# Patient Record
Sex: Female | Born: 1985 | Hispanic: Yes | Marital: Married | Smoking: Never smoker
Health system: Southern US, Community
[De-identification: ages and names within clinical notes are randomized; demographics above are authoritative.]

## PROBLEM LIST (undated history)

## (undated) DIAGNOSIS — Z789 Other specified health status: Secondary | ICD-10-CM

---

## 2009-07-15 ENCOUNTER — Ambulatory Visit (HOSPITAL_COMMUNITY): Admission: RE | Admit: 2009-07-15 | Discharge: 2009-07-15 | Payer: Self-pay | Admitting: Obstetrics

## 2009-12-10 ENCOUNTER — Ambulatory Visit (HOSPITAL_COMMUNITY): Admission: RE | Admit: 2009-12-10 | Discharge: 2009-12-10 | Payer: Self-pay | Admitting: Obstetrics

## 2010-02-19 ENCOUNTER — Inpatient Hospital Stay (HOSPITAL_COMMUNITY): Admission: AD | Admit: 2010-02-19 | Discharge: 2010-02-21 | Payer: Self-pay | Admitting: Obstetrics

## 2010-02-19 ENCOUNTER — Inpatient Hospital Stay (HOSPITAL_COMMUNITY): Admission: AD | Admit: 2010-02-19 | Discharge: 2010-02-19 | Payer: Self-pay | Admitting: Obstetrics

## 2010-02-24 ENCOUNTER — Inpatient Hospital Stay (HOSPITAL_COMMUNITY): Admission: AD | Admit: 2010-02-24 | Discharge: 2010-02-24 | Payer: Self-pay | Admitting: Obstetrics and Gynecology

## 2010-12-20 LAB — URINE CULTURE: Culture: NO GROWTH

## 2010-12-20 LAB — CBC
Hemoglobin: 13.2 g/dL (ref 12.0–15.0)
RBC: 3.28 MIL/uL — ABNORMAL LOW (ref 3.87–5.11)
RBC: 3.99 MIL/uL (ref 3.87–5.11)
RDW: 13.1 % (ref 11.5–15.5)
RDW: 13.4 % (ref 11.5–15.5)

## 2010-12-20 LAB — URINALYSIS, ROUTINE W REFLEX MICROSCOPIC
Glucose, UA: NEGATIVE mg/dL
Nitrite: NEGATIVE
Specific Gravity, Urine: 1.015 (ref 1.005–1.030)
Urobilinogen, UA: 1 mg/dL (ref 0.0–1.0)
pH: 7 (ref 5.0–8.0)

## 2010-12-20 LAB — URINE MICROSCOPIC-ADD ON

## 2010-12-20 LAB — RH IMMUNE GLOB WKUP(>/=20WKS)(NOT WOMEN'S HOSP)

## 2010-12-27 LAB — RH IMMUNE GLOBULIN WORKUP (NOT WOMEN'S HOSP): ABO/RH(D): O NEG

## 2012-02-18 ENCOUNTER — Ambulatory Visit: Payer: Self-pay | Admitting: Family Medicine

## 2012-02-18 VITALS — BP 103/69 | HR 76 | Temp 98.0°F | Resp 16 | Ht 64.5 in | Wt 142.4 lb

## 2012-02-18 DIAGNOSIS — M542 Cervicalgia: Secondary | ICD-10-CM

## 2012-02-18 DIAGNOSIS — Z789 Other specified health status: Secondary | ICD-10-CM

## 2012-02-18 DIAGNOSIS — N926 Irregular menstruation, unspecified: Secondary | ICD-10-CM

## 2012-02-18 DIAGNOSIS — N949 Unspecified condition associated with female genital organs and menstrual cycle: Secondary | ICD-10-CM

## 2012-02-18 DIAGNOSIS — N644 Mastodynia: Secondary | ICD-10-CM

## 2012-02-18 DIAGNOSIS — Z609 Problem related to social environment, unspecified: Secondary | ICD-10-CM

## 2012-02-18 DIAGNOSIS — R110 Nausea: Secondary | ICD-10-CM

## 2012-02-18 LAB — POCT CBC
HCT, POC: 39.5 % (ref 37.7–47.9)
MCV: 86.3 fL (ref 80–97)
MPV: 9.7 fL (ref 0–99.8)
POC Granulocyte: 5.2 (ref 2–6.9)
POC LYMPH PERCENT: 35.4 %L (ref 10–50)
POC MID %: 4.3 %M (ref 0–12)
Platelet Count, POC: 255 10*3/uL (ref 142–424)
RBC: 4.58 M/uL (ref 4.04–5.48)

## 2012-02-18 MED ORDER — MELOXICAM 7.5 MG PO TABS: 7.5000 mg | ORAL_TABLET | Freq: Every day | ORAL | Status: AC

## 2012-02-18 NOTE — Progress Notes (Signed)
Subjective:    Patient ID: Patricia Lam, female    DOB: 09-20-1986, 26 y.o.   MRN: [id number]  HPI Patricia Lam is a 26 y.o. female  Headache in past - see prior ov 04/07/11.  Tingling into hand then.  Had cervical spine xray indicating loss of lordosis.  Still sore in this area and notices popping/crackling in neck at times.  Prior rx mobic and robaxin - not taking any treatments since. Episodic   C/o breast pain - both sides for 1 week,   LMP may 3rd, but was late for 10-12 days.  No current contraceptive pills - stopped 5 months ago.  Has appt with [facility] office  in few days to discuss other forms of contraception.  Has only been using condoms.  Abd pain - twice in past, but brief and resolved.  Nausea in the morning only, few days this past week.  Last breastfed 20 months ago (2 yo son).  Has been able to express milk from breasts since 26 yo was born.  Spanish speaking only - attempted to speak spanish, then phone interpreter used for clarification of history and reasons for visit. 17 minute initial call.    Review of Systems  Constitutional: Negative for fever and chills.  Gastrointestinal: Positive for nausea.  Skin: Negative for color change, rash and wound.       Objective:   Physical Exam  Constitutional: She is oriented to person, place, and time. She appears well-developed and well-nourished.  HENT:  Head: Normocephalic and atraumatic.  Pulmonary/Chest: Effort normal. Right breast exhibits nipple discharge and tenderness. Right breast exhibits no inverted nipple, no mass and no skin change. Left breast exhibits nipple discharge. Left breast exhibits no inverted nipple, no mass and no skin change.       Small amount milk self expressed by pt with squeezing on breast, otherwise no nipple discharge.  Musculoskeletal:       Cervical back: She exhibits normal range of motion, no tenderness, no swelling and no edema.  Neurological: She is alert and oriented to person,  place, and time.  Skin: Skin is warm and dry.  Psychiatric: She has a normal mood and affect. Her behavior is normal.   Reviewed prior XRay report (04/07/11) reviewed.  Results for orders placed in visit on 02/18/12  POCT URINE PREGNANCY      Component Value Range   Preg Test, Ur Negative    POCT CBC      Component Value Range   WBC 8.6  4.6 - 10.2 (K/uL)   Lymph, poc 3.0  0.6 - 3.4    POC LYMPH PERCENT 35.4  10 - 50 (%L)   MID (cbc) 0.4  0 - 0.9    POC MID % 4.3  0 - 12 (%M)   POC Granulocyte 5.2  2 - 6.9    Granulocyte percent 60.3  37 - 80 (%G)   RBC 4.58  4.04 - 5.48 (M/uL)   Hemoglobin 12.9  12.2 - 16.2 (g/dL)   HCT, POC 40.9  81.1 - 47.9 (%)   MCV 86.3  80 - 97 (fL)   MCH, POC 28.2  27 - 31.2 (pg)   MCHC 32.7  31.8 - 35.4 (g/dL)   RDW, POC 91.4     Platelet Count, POC 255  142 - 424 (K/uL)   MPV 9.7  0 - 99.8 (fL)        Assessment & Plan:  Patricia Lam is a 26 y.o. female 1.  Breast pain  POCT urine pregnancy  2. Nausea  POCT urine pregnancy  3. Late menses  POCT urine pregnancy   Bilateral breast pain, intermittent nausea x 1 week, with prior late menses.  Recent menses normal and negative HCG.  Can recheck in next 2 weeks if desired.  No apparent skin erythema.  No palpable focal abnormality.  Start with tylenol otc - if no relief, can take mobic 7.5mg  QD - and recheck in next 2weeks,  Avoid repetitive pressure or manipulation as this can be causing some soreness.  sooner or to ER if any skin color changes, or increased pain.  Understanding expressed  Neck pain/ha - intermittent - suspected msk spasm.  Tylenol otc as above, or mobic if not improving. neck care instructions printed, and recheck with possible Xray if not improving in next few weeks.  Understanding expressed.   Language barrier - intrepreter used and spanish spoken.  Understanding expressed.

## 2017-01-05 ENCOUNTER — Other Ambulatory Visit (HOSPITAL_COMMUNITY): Payer: Self-pay | Admitting: Nurse Practitioner

## 2017-01-05 DIAGNOSIS — Z363 Encounter for antenatal screening for malformations: Secondary | ICD-10-CM

## 2017-01-05 LAB — OB RESULTS CONSOLE RUBELLA ANTIBODY, IGM: Rubella: IMMUNE

## 2017-01-05 LAB — OB RESULTS CONSOLE GC/CHLAMYDIA
CHLAMYDIA, DNA PROBE: NEGATIVE
GC PROBE AMP, GENITAL: NEGATIVE

## 2017-01-05 LAB — OB RESULTS CONSOLE ANTIBODY SCREEN: Antibody Screen: NEGATIVE

## 2017-01-05 LAB — OB RESULTS CONSOLE ABO/RH: RH TYPE: NEGATIVE

## 2017-01-05 LAB — OB RESULTS CONSOLE HEPATITIS B SURFACE ANTIGEN: HEP B S AG: NEGATIVE

## 2017-01-05 LAB — OB RESULTS CONSOLE RPR: RPR: NONREACTIVE

## 2017-01-05 LAB — OB RESULTS CONSOLE HIV ANTIBODY (ROUTINE TESTING): HIV: NONREACTIVE

## 2017-01-19 ENCOUNTER — Encounter (HOSPITAL_COMMUNITY): Payer: Self-pay | Admitting: *Deleted

## 2017-01-20 ENCOUNTER — Other Ambulatory Visit (HOSPITAL_COMMUNITY): Payer: Self-pay | Admitting: Nurse Practitioner

## 2017-01-20 ENCOUNTER — Ambulatory Visit (HOSPITAL_COMMUNITY)
Admission: RE | Admit: 2017-01-20 | Discharge: 2017-01-20 | Disposition: A | Discharge status: Home / Self Care | Payer: Self-pay | Source: Ambulatory Visit | Attending: Nurse Practitioner | Admitting: Nurse Practitioner

## 2017-01-20 ENCOUNTER — Other Ambulatory Visit (HOSPITAL_COMMUNITY): Payer: Self-pay | Admitting: *Deleted

## 2017-01-20 ENCOUNTER — Encounter (HOSPITAL_COMMUNITY): Payer: Self-pay

## 2017-01-20 DIAGNOSIS — Z3A12 12 weeks gestation of pregnancy: Secondary | ICD-10-CM | POA: Insufficient documentation

## 2017-01-20 DIAGNOSIS — Z3682 Encounter for antenatal screening for nuchal translucency: Secondary | ICD-10-CM

## 2017-01-20 DIAGNOSIS — Z363 Encounter for antenatal screening for malformations: Secondary | ICD-10-CM | POA: Insufficient documentation

## 2017-01-20 DIAGNOSIS — O30009 Twin pregnancy, unspecified number of placenta and unspecified number of amniotic sacs, unspecified trimester: Secondary | ICD-10-CM

## 2017-01-20 DIAGNOSIS — Z3689 Encounter for other specified antenatal screening: Secondary | ICD-10-CM

## 2017-01-20 DIAGNOSIS — O34219 Maternal care for unspecified type scar from previous cesarean delivery: Secondary | ICD-10-CM

## 2017-01-20 HISTORY — DX: Other specified health status: Z78.9

## 2017-01-20 NOTE — ED Notes (Signed)
[place] [employer] interpreter [phone] accessed for visit.

## 2017-01-30 ENCOUNTER — Other Ambulatory Visit: Payer: Self-pay

## 2017-03-01 ENCOUNTER — Other Ambulatory Visit (HOSPITAL_COMMUNITY): Payer: Self-pay | Admitting: Obstetrics and Gynecology

## 2017-03-01 ENCOUNTER — Ambulatory Visit (HOSPITAL_COMMUNITY)
Admission: RE | Admit: 2017-03-01 | Discharge: 2017-03-01 | Disposition: A | Discharge status: Home / Self Care | Payer: Self-pay | Source: Ambulatory Visit | Attending: Obstetrics and Gynecology | Admitting: Obstetrics and Gynecology

## 2017-03-01 DIAGNOSIS — O34219 Maternal care for unspecified type scar from previous cesarean delivery: Secondary | ICD-10-CM | POA: Insufficient documentation

## 2017-03-01 DIAGNOSIS — Z3689 Encounter for other specified antenatal screening: Secondary | ICD-10-CM

## 2017-03-01 DIAGNOSIS — O30009 Twin pregnancy, unspecified number of placenta and unspecified number of amniotic sacs, unspecified trimester: Secondary | ICD-10-CM

## 2017-03-01 DIAGNOSIS — O321XX0 Maternal care for breech presentation, not applicable or unspecified: Secondary | ICD-10-CM | POA: Insufficient documentation

## 2017-03-01 DIAGNOSIS — Z3A18 18 weeks gestation of pregnancy: Secondary | ICD-10-CM

## 2017-03-03 ENCOUNTER — Other Ambulatory Visit (HOSPITAL_COMMUNITY): Payer: Self-pay

## 2017-06-12 ENCOUNTER — Encounter (HOSPITAL_COMMUNITY): Payer: Self-pay

## 2017-06-22 ENCOUNTER — Other Ambulatory Visit: Payer: Self-pay | Admitting: Family Medicine

## 2017-06-27 LAB — OB RESULTS CONSOLE GBS: GBS: NEGATIVE

## 2017-07-03 ENCOUNTER — Other Ambulatory Visit: Payer: Self-pay | Admitting: Obstetrics & Gynecology

## 2017-07-06 ENCOUNTER — Encounter (HOSPITAL_COMMUNITY): Payer: Self-pay

## 2017-07-06 NOTE — Pre-Procedure Instructions (Signed)
Interpreter number [phone]

## 2017-07-14 NOTE — Patient Instructions (Addendum)
Patricia Lam  07/14/2017                                           Instrucciones Para Antes de la Ciruga   Su ciruga est programada para  07/18/2017  (your procedure is scheduled on) Entre por la entrada principal del [facility]  a las [address] de la [place] -(enter through the main entrance at [facility] at  [place], [place] el [phone] para informarnos de su llegada. (pick up phone, dial [phone] on arrival)     Por favor llame al [phone] si tiene algn problema en la maana de la ciruga (please call  if you have any problems the morning of surgery.)                  Recuerde: (Remember)  No coma alimentos despues a la media. (Do not eat food )    No tome lquidos claros despues a la media. (Do not drink clear liquids )    No use joyas, maquillaje de ojos, lpiz labial, crema para el cuerpo o esmalte de uas oscuro. (Do not wear jewelry, eye makeup, lipstick, body lotion, or dark fingernail polish). Puede usar desodorante (you may wear deodorant)    No se afeite 48 horas de su ciruga. (Do not shave 48 hours before your surgery)    No traiga objetos de valor al hospital.  [facility] no se hace responsable de ninguna pertenencia, ni objetos de valor que haya trado al hospital. (Do not bring valuable to the hospital.  [facility] is not responsible for any belongings or valuables brought to the hospital)   [facility] medicinas en la maana de la ciruga con un SORBITO de agua nada (take these meds the morning of surgery with a SIP of water)     Durante la ciruga no se pueden usar lentes de contacto, dentaduras o puentes. (Contacts, dentures or bridgework cannot be worn in surgery).   Si va a ser ingresado despus de la ciruga, deje la [employer] en el carro hasta que se le haya asignado una habitacin. (If you are to be admitted after surgery, leave suitcase in car until your room has been assigned.)   A los pacientes que se les d de alta el mismo  da no se les permitir manejar a casa.  (Patients discharged on the day of surgery will not be allowed to drive home)    [place] y nmero de telfono del Programmer, multimedia na. (Name and telephone number of your driver)   Instrucciones especiales N/A (Special Instructions)   Por favor, lea las hojas informativas que le entregaron. (Please read over the following fact sheets that you were given) Surgical Site Infection Prevention

## 2017-07-17 ENCOUNTER — Other Ambulatory Visit: Payer: Self-pay | Admitting: Family Medicine

## 2017-07-17 ENCOUNTER — Encounter (HOSPITAL_COMMUNITY)
Admission: RE | Admit: 2017-07-17 | Discharge: 2017-07-17 | Disposition: A | Discharge status: Home / Self Care | Payer: Medicaid Other | Source: Ambulatory Visit | Attending: Family Medicine | Admitting: Family Medicine

## 2017-07-17 LAB — CBC
HCT: 37.7 % (ref 36.0–46.0)
Hemoglobin: 13.1 g/dL (ref 12.0–15.0)
MCH: 31.6 pg (ref 26.0–34.0)
MCHC: 34.7 g/dL (ref 30.0–36.0)
MCV: 90.8 fL (ref 78.0–100.0)
PLATELETS: 158 10*3/uL (ref 150–400)
RBC: 4.15 MIL/uL (ref 3.87–5.11)
RDW: 13.6 % (ref 11.5–15.5)
WBC: 12.2 10*3/uL — AB (ref 4.0–10.5)

## 2017-07-17 LAB — RAPID HIV SCREEN (HIV 1/2 AB+AG)
HIV 1/2 Antibodies: NONREACTIVE
HIV-1 P24 ANTIGEN - HIV24: NONREACTIVE

## 2017-07-17 LAB — TYPE AND SCREEN
ABO/RH(D): O NEG
Antibody Screen: NEGATIVE

## 2017-07-18 ENCOUNTER — Encounter (HOSPITAL_COMMUNITY)
Admission: AD | Disposition: A | Discharge status: Home / Self Care | Payer: Self-pay | Source: Ambulatory Visit | Attending: Family Medicine

## 2017-07-18 ENCOUNTER — Inpatient Hospital Stay (HOSPITAL_COMMUNITY)
Admission: AD | Admit: 2017-07-18 | Discharge: 2017-07-20 | DRG: 787 | Disposition: A | Discharge status: Home / Self Care | Payer: Medicaid Other | Source: Ambulatory Visit | Attending: Family Medicine | Admitting: Family Medicine

## 2017-07-18 ENCOUNTER — Encounter (HOSPITAL_COMMUNITY): Payer: Self-pay | Admitting: *Deleted

## 2017-07-18 ENCOUNTER — Inpatient Hospital Stay (HOSPITAL_COMMUNITY): Payer: Medicaid Other | Admitting: Anesthesiology

## 2017-07-18 DIAGNOSIS — Z3A39 39 weeks gestation of pregnancy: Secondary | ICD-10-CM

## 2017-07-18 DIAGNOSIS — O9912 Other diseases of the blood and blood-forming organs and certain disorders involving the immune mechanism complicating childbirth: Secondary | ICD-10-CM | POA: Diagnosis present

## 2017-07-18 DIAGNOSIS — Z98891 History of uterine scar from previous surgery: Secondary | ICD-10-CM

## 2017-07-18 DIAGNOSIS — O34211 Maternal care for low transverse scar from previous cesarean delivery: Secondary | ICD-10-CM | POA: Diagnosis present

## 2017-07-18 DIAGNOSIS — D696 Thrombocytopenia, unspecified: Secondary | ICD-10-CM | POA: Diagnosis not present

## 2017-07-18 HISTORY — DX: History of uterine scar from previous surgery: Z98.891

## 2017-07-18 LAB — RPR: RPR: NONREACTIVE

## 2017-07-18 SURGERY — Surgical Case
Anesthesia: Spinal

## 2017-07-18 MED ORDER — ONDANSETRON HCL 4 MG/2ML IJ SOLN
INTRAMUSCULAR | Status: AC
  Filled 2017-07-18: qty 2

## 2017-07-18 MED ORDER — SENNOSIDES-DOCUSATE SODIUM 8.6-50 MG PO TABS
2.0000 | ORAL_TABLET | ORAL | Status: DC
  Administered 2017-07-18 – 2017-07-20 (×2): 2 via ORAL
  Filled 2017-07-18 (×4): qty 2

## 2017-07-18 MED ORDER — DIPHENHYDRAMINE HCL 25 MG PO CAPS
25.0000 mg | ORAL_CAPSULE | ORAL | Status: DC | PRN
  Filled 2017-07-18: qty 1

## 2017-07-18 MED ORDER — ONDANSETRON HCL 4 MG/2ML IJ SOLN: 4.0000 mg | Freq: Three times a day (TID) | INTRAMUSCULAR | Status: DC | PRN

## 2017-07-18 MED ORDER — SOD CITRATE-CITRIC ACID 500-334 MG/5ML PO SOLN
30.0000 mL | Freq: Once | ORAL | Status: AC
  Administered 2017-07-18: 30 mL via ORAL
  Filled 2017-07-18: qty 15

## 2017-07-18 MED ORDER — OXYTOCIN 10 UNIT/ML IJ SOLN
INTRAMUSCULAR | Status: AC
  Filled 2017-07-18: qty 4

## 2017-07-18 MED ORDER — ACETAMINOPHEN 500 MG PO TABS
1000.0000 mg | ORAL_TABLET | Freq: Four times a day (QID) | ORAL | Status: AC
  Administered 2017-07-18 – 2017-07-19 (×3): 1000 mg via ORAL
  Filled 2017-07-18 (×3): qty 2

## 2017-07-18 MED ORDER — FAMOTIDINE 20 MG PO TABS
20.0000 mg | ORAL_TABLET | Freq: Once | ORAL | Status: AC
  Administered 2017-07-18: 20 mg via ORAL
  Filled 2017-07-18: qty 1

## 2017-07-18 MED ORDER — LACTATED RINGERS IV SOLN
125.0000 mL/h | INTRAVENOUS | Status: DC
  Administered 2017-07-18: 125 mL/h via INTRAVENOUS
  Administered 2017-07-18: 09:00:00 via INTRAVENOUS
  Administered 2017-07-18: 125 mL/h via INTRAVENOUS

## 2017-07-18 MED ORDER — CEFAZOLIN SODIUM-DEXTROSE 2-4 GM/100ML-% IV SOLN
2.0000 g | INTRAVENOUS | Status: AC
  Administered 2017-07-18: 2 g via INTRAVENOUS
  Filled 2017-07-18: qty 100

## 2017-07-18 MED ORDER — SODIUM CHLORIDE 0.9 % IR SOLN
Status: DC | PRN
  Administered 2017-07-18: 200 mL

## 2017-07-18 MED ORDER — LACTATED RINGERS IV SOLN
INTRAVENOUS | Status: DC
  Administered 2017-07-18: 1 mL via INTRAVENOUS

## 2017-07-18 MED ORDER — ACETAMINOPHEN 325 MG PO TABS: 650.0000 mg | ORAL_TABLET | ORAL | Status: DC | PRN

## 2017-07-18 MED ORDER — ZOLPIDEM TARTRATE 5 MG PO TABS: 5.0000 mg | ORAL_TABLET | Freq: Every evening | ORAL | Status: DC | PRN

## 2017-07-18 MED ORDER — CEFAZOLIN SODIUM-DEXTROSE 2-4 GM/100ML-% IV SOLN: 2.0000 g | INTRAVENOUS | Status: DC

## 2017-07-18 MED ORDER — MORPHINE SULFATE (PF) 0.5 MG/ML IJ SOLN
INTRAMUSCULAR | Status: AC
  Filled 2017-07-18: qty 10

## 2017-07-18 MED ORDER — RHO D IMMUNE GLOBULIN 1500 UNIT/2ML IJ SOSY
300.0000 ug | PREFILLED_SYRINGE | Freq: Once | INTRAMUSCULAR | Status: AC
  Administered 2017-07-18: 300 ug via INTRAVENOUS
  Filled 2017-07-18: qty 2

## 2017-07-18 MED ORDER — WITCH HAZEL-GLYCERIN EX PADS: 1.0000 "application " | MEDICATED_PAD | CUTANEOUS | Status: DC | PRN

## 2017-07-18 MED ORDER — TETANUS-DIPHTH-ACELL PERTUSSIS 5-2.5-18.5 LF-MCG/0.5 IM SUSP: 0.5000 mL | Freq: Once | INTRAMUSCULAR | Status: DC

## 2017-07-18 MED ORDER — BUPIVACAINE IN DEXTROSE 0.75-8.25 % IT SOLN
INTRATHECAL | Status: AC
  Filled 2017-07-18: qty 2

## 2017-07-18 MED ORDER — PHENYLEPHRINE 8 MG IN D5W 100 ML (0.08MG/ML) PREMIX OPTIME
INJECTION | INTRAVENOUS | Status: AC
  Filled 2017-07-18: qty 100

## 2017-07-18 MED ORDER — SIMETHICONE 80 MG PO CHEW: 80.0000 mg | CHEWABLE_TABLET | ORAL | Status: DC | PRN

## 2017-07-18 MED ORDER — PHENYLEPHRINE 8 MG IN D5W 100 ML (0.08MG/ML) PREMIX OPTIME
INJECTION | INTRAVENOUS | Status: DC | PRN
  Administered 2017-07-18: 60 ug/min via INTRAVENOUS

## 2017-07-18 MED ORDER — NALBUPHINE HCL 10 MG/ML IJ SOLN: 5.0000 mg | Freq: Once | INTRAMUSCULAR | Status: DC | PRN

## 2017-07-18 MED ORDER — NALOXONE HCL 0.4 MG/ML IJ SOLN: 0.4000 mg | INTRAMUSCULAR | Status: DC | PRN

## 2017-07-18 MED ORDER — DEXAMETHASONE SODIUM PHOSPHATE 10 MG/ML IJ SOLN
INTRAMUSCULAR | Status: AC
  Filled 2017-07-18: qty 1

## 2017-07-18 MED ORDER — FENTANYL CITRATE (PF) 100 MCG/2ML IJ SOLN
INTRAMUSCULAR | Status: AC
  Filled 2017-07-18: qty 2

## 2017-07-18 MED ORDER — KETOROLAC TROMETHAMINE 30 MG/ML IJ SOLN
INTRAMUSCULAR | Status: AC
  Filled 2017-07-18: qty 1

## 2017-07-18 MED ORDER — LACTATED RINGERS IV SOLN
INTRAVENOUS | Status: DC | PRN
  Administered 2017-07-18: 10:00:00 via INTRAVENOUS

## 2017-07-18 MED ORDER — MENTHOL 3 MG MT LOZG: 1.0000 | LOZENGE | OROMUCOSAL | Status: DC | PRN

## 2017-07-18 MED ORDER — SCOPOLAMINE 1 MG/3DAYS TD PT72
1.0000 | MEDICATED_PATCH | Freq: Once | TRANSDERMAL | Status: DC
  Administered 2017-07-18: 1.5 mg via TRANSDERMAL
  Filled 2017-07-18: qty 1

## 2017-07-18 MED ORDER — MEPERIDINE HCL 25 MG/ML IJ SOLN: 6.2500 mg | INTRAMUSCULAR | Status: DC | PRN

## 2017-07-18 MED ORDER — PROMETHAZINE HCL 25 MG/ML IJ SOLN: 6.2500 mg | INTRAMUSCULAR | Status: DC | PRN

## 2017-07-18 MED ORDER — KETOROLAC TROMETHAMINE 30 MG/ML IJ SOLN
30.0000 mg | Freq: Four times a day (QID) | INTRAMUSCULAR | Status: AC | PRN
  Administered 2017-07-18: 30 mg via INTRAMUSCULAR

## 2017-07-18 MED ORDER — IBUPROFEN 600 MG PO TABS
600.0000 mg | ORAL_TABLET | Freq: Four times a day (QID) | ORAL | Status: DC
  Administered 2017-07-18 – 2017-07-20 (×6): 600 mg via ORAL
  Filled 2017-07-18 (×6): qty 1

## 2017-07-18 MED ORDER — OXYTOCIN 10 UNIT/ML IJ SOLN
INTRAVENOUS | Status: DC | PRN
  Administered 2017-07-18: 40 [IU] via INTRAVENOUS

## 2017-07-18 MED ORDER — SIMETHICONE 80 MG PO CHEW
80.0000 mg | CHEWABLE_TABLET | ORAL | Status: DC
  Administered 2017-07-18 – 2017-07-20 (×2): 80 mg via ORAL
  Filled 2017-07-18 (×2): qty 1

## 2017-07-18 MED ORDER — DIPHENHYDRAMINE HCL 25 MG PO CAPS
25.0000 mg | ORAL_CAPSULE | Freq: Four times a day (QID) | ORAL | Status: DC | PRN
  Filled 2017-07-18: qty 1

## 2017-07-18 MED ORDER — OXYTOCIN 40 UNITS IN LACTATED RINGERS INFUSION - SIMPLE MED: 2.5000 [IU]/h | INTRAVENOUS | Status: AC

## 2017-07-18 MED ORDER — DEXTROSE 5 % IV SOLN
1.0000 ug/kg/h | INTRAVENOUS | Status: DC | PRN
  Filled 2017-07-18: qty 2

## 2017-07-18 MED ORDER — PRENATAL MULTIVITAMIN CH
1.0000 | ORAL_TABLET | Freq: Every day | ORAL | Status: DC
  Administered 2017-07-19: 1 via ORAL
  Filled 2017-07-18: qty 1

## 2017-07-18 MED ORDER — NALBUPHINE HCL 10 MG/ML IJ SOLN: 5.0000 mg | INTRAMUSCULAR | Status: DC | PRN

## 2017-07-18 MED ORDER — DEXAMETHASONE SODIUM PHOSPHATE 10 MG/ML IJ SOLN
INTRAMUSCULAR | Status: DC | PRN
  Administered 2017-07-18: 10 mg via INTRAVENOUS

## 2017-07-18 MED ORDER — FENTANYL CITRATE (PF) 100 MCG/2ML IJ SOLN
25.0000 ug | INTRAMUSCULAR | Status: DC | PRN
  Administered 2017-07-18: 25 ug via INTRAVENOUS

## 2017-07-18 MED ORDER — SIMETHICONE 80 MG PO CHEW
80.0000 mg | CHEWABLE_TABLET | Freq: Three times a day (TID) | ORAL | Status: DC
  Administered 2017-07-18 – 2017-07-20 (×5): 80 mg via ORAL
  Filled 2017-07-18 (×10): qty 1

## 2017-07-18 MED ORDER — COCONUT OIL OIL
1.0000 | TOPICAL_OIL | Status: DC | PRN
Start: 2017-07-18 — End: 2017-07-20

## 2017-07-18 MED ORDER — BUPIVACAINE IN DEXTROSE 0.75-8.25 % IT SOLN
INTRATHECAL | Status: DC | PRN
  Administered 2017-07-18: 1.4 mL via INTRATHECAL
  Administered 2017-07-18: 1 mL via INTRATHECAL

## 2017-07-18 MED ORDER — DIPHENHYDRAMINE HCL 50 MG/ML IJ SOLN: 12.5000 mg | INTRAMUSCULAR | Status: DC | PRN

## 2017-07-18 MED ORDER — DIBUCAINE 1 % RE OINT
1.0000 | TOPICAL_OINTMENT | RECTAL | Status: DC | PRN
Start: 2017-07-18 — End: 2017-07-20

## 2017-07-18 MED ORDER — ONDANSETRON HCL 4 MG/2ML IJ SOLN
INTRAMUSCULAR | Status: DC | PRN
  Administered 2017-07-18: 4 mg via INTRAVENOUS

## 2017-07-18 MED ORDER — KETOROLAC TROMETHAMINE 30 MG/ML IJ SOLN
30.0000 mg | Freq: Four times a day (QID) | INTRAMUSCULAR | Status: AC | PRN
Start: 2017-07-18 — End: 2017-07-19
  Administered 2017-07-18: 30 mg via INTRAVENOUS
  Filled 2017-07-18: qty 1

## 2017-07-18 MED ORDER — MORPHINE SULFATE (PF) 0.5 MG/ML IJ SOLN
INTRAMUSCULAR | Status: DC | PRN
  Administered 2017-07-18: .2 mg via INTRATHECAL
  Administered 2017-07-18: 3 mg via INTRAVENOUS

## 2017-07-18 MED ORDER — FENTANYL CITRATE (PF) 100 MCG/2ML IJ SOLN
INTRAMUSCULAR | Status: DC | PRN
  Administered 2017-07-18: 10 ug via INTRATHECAL
  Administered 2017-07-18: 90 ug via INTRAVENOUS

## 2017-07-18 MED ORDER — SODIUM CHLORIDE 0.9% FLUSH: 3.0000 mL | INTRAVENOUS | Status: DC | PRN

## 2017-07-18 SURGICAL SUPPLY — 37 items
BENZOIN TINCTURE PRP APPL 2/3 (GAUZE/BANDAGES/DRESSINGS) ×3 IMPLANT
CHLORAPREP W/TINT 26ML (MISCELLANEOUS) ×3 IMPLANT
CLAMP CORD UMBIL (MISCELLANEOUS) IMPLANT
CLOSURE STERI-STRIP 1/2X4 (GAUZE/BANDAGES/DRESSINGS) ×1
CLOSURE WOUND 1/2 X4 (GAUZE/BANDAGES/DRESSINGS) ×1
CLOTH BEACON ORANGE TIMEOUT ST (SAFETY) ×3 IMPLANT
CLSR STERI-STRIP ANTIMIC 1/2X4 (GAUZE/BANDAGES/DRESSINGS) ×2 IMPLANT
DRSG OPSITE POSTOP 4X10 (GAUZE/BANDAGES/DRESSINGS) ×3 IMPLANT
ELECT REM PT RETURN 9FT ADLT (ELECTROSURGICAL) ×3
ELECTRODE REM PT RTRN 9FT ADLT (ELECTROSURGICAL) ×1 IMPLANT
EXTRACTOR VACUUM M CUP 4 TUBE (SUCTIONS) IMPLANT
EXTRACTOR VACUUM M CUP 4' TUBE (SUCTIONS)
GLOVE BIOGEL PI IND STRL 6 (GLOVE) ×2 IMPLANT
GLOVE BIOGEL PI IND STRL 7.0 (GLOVE) ×2 IMPLANT
GLOVE BIOGEL PI IND STRL 7.5 (GLOVE) ×2 IMPLANT
GLOVE BIOGEL PI INDICATOR 6 (GLOVE) ×4
GLOVE BIOGEL PI INDICATOR 7.0 (GLOVE) ×4
GLOVE BIOGEL PI INDICATOR 7.5 (GLOVE) ×4
GLOVE ECLIPSE 7.5 STRL STRAW (GLOVE) ×3 IMPLANT
GOWN STRL REUS W/TWL LRG LVL3 (GOWN DISPOSABLE) ×9 IMPLANT
KIT ABG SYR 3ML LUER SLIP (SYRINGE) IMPLANT
NEEDLE HYPO 25X5/8 SAFETYGLIDE (NEEDLE) IMPLANT
NS IRRIG 1000ML POUR BTL (IV SOLUTION) ×3 IMPLANT
PACK C SECTION WH (CUSTOM PROCEDURE TRAY) ×3 IMPLANT
PAD OB MATERNITY 4.3X12.25 (PERSONAL CARE ITEMS) ×3 IMPLANT
PENCIL SMOKE EVAC W/HOLSTER (ELECTROSURGICAL) ×3 IMPLANT
RTRCTR C-SECT PINK 25CM LRG (MISCELLANEOUS) ×3 IMPLANT
SPONGE LAP 18X18 X RAY DECT (DISPOSABLE) ×3 IMPLANT
STRIP CLOSURE SKIN 1/2X4 (GAUZE/BANDAGES/DRESSINGS) ×2 IMPLANT
SUT VIC AB 0 CTX 36 (SUTURE) ×6
SUT VIC AB 0 CTX36XBRD ANBCTRL (SUTURE) ×3 IMPLANT
SUT VIC AB 2-0 CT1 27 (SUTURE) ×4
SUT VIC AB 2-0 CT1 TAPERPNT 27 (SUTURE) ×2 IMPLANT
SUT VIC AB 4-0 KS 27 (SUTURE) ×3 IMPLANT
SUT VIC AB 4-0 PS2 27 (SUTURE) ×6 IMPLANT
TOWEL OR 17X24 6PK STRL BLUE (TOWEL DISPOSABLE) ×3 IMPLANT
TRAY FOLEY BAG SILVER LF 14FR (SET/KITS/TRAYS/PACK) ×3 IMPLANT

## 2017-07-18 NOTE — H&P (Signed)
Obstetric Preoperative History and Physical  Scotlyn Mccranie is a 31 y.o. [id number] with IUP at [redacted]w[redacted]d presenting for scheduled cesarean section.  No acute concerns.   Prenatal Course Source of Care: [facility]   Pregnancy complications or risks:There are no active problems to display for this patient.  She plans to breastfeed, plans to bottle feed She desires IUD for postpartum contraception.   Prenatal labs and studies: ABO, Rh: --/--/O NEG (10/15 1050) Antibody: NEG (10/15 1050) Rubella: Immune (04/05 0000) RPR: Non Reactive (10/15 1050)  HBsAg: Negative (04/05 0000)  HIV: Non-reactive (04/05 0000)  AVW:UJWJXBJY (09/25 0000) 1 hr Glucola  79 Genetic screening normal Anatomy US normal  Prenatal Transfer Tool  Maternal Diabetes: No Genetic Screening: Normal Maternal Ultrasounds/Referrals: Normal Fetal Ultrasounds or other Referrals:  None Maternal Substance Abuse:  No Significant Maternal Medications:  None Significant Maternal Lab Results: None  Past Medical History:  Diagnosis Date  . Medical history non-contributory     Past Surgical History:  Procedure Laterality Date  . CESAREAN SECTION      OB History  Gravida Para Term Preterm AB Living  SAB TAB Ectopic Multiple Live Births          1    # Outcome Date GA Lbr Len/2nd Weight Sex Delivery Anes PTL Lv  3 Current           2 Term 2011    M CS-LTranv   LIV  1 Term      CS-LTranv         Social History   Social History  . Marital status: Single    Spouse name: N/A  . Number of children: N/A  . Years of education: N/A   Social History Main Topics  . Smoking status: Never Smoker  . Smokeless tobacco: Never Used  . Alcohol use No  . Drug use: No  . Sexual activity: Not Asked   Other Topics Concern  . None   Social History Narrative  . None    No family history on file.  Prescriptions Prior to Admission  Medication Sig Dispense Refill Last Dose  . Prenatal Vit-Fe Fumarate-FA  (PRENATAL VITAMIN PO) Take 1 tablet by mouth daily.    07/06/2017 at Unknown time    No Known Allergies  Review of Systems: Negative except for what is mentioned in HPI.  Physical Exam: BP 98/73 (BP Location: Right Arm)   Pulse 96   Temp 99.1 F (37.3 C) (Oral)   Resp 18   Ht  (1.651 m)   Wt 188 lb (85.3 kg)   LMP 10/24/2016 (Approximate)   BMI 31.28 kg/m  FHR by Doppler: 150 bpm CONSTITUTIONAL: Well-developed, well-nourished female in no acute distress.  HENT:  Normocephalic, atraumatic. Oropharynx is clear and moist EYES: Conjunctivae and EOM are normal. No scleral icterus.  NECK: Normal range of motion, supple SKIN: Skin is warm and dry. No rash noted. Not diaphoretic. No erythema. NEUROLGIC: Alert and oriented to person, place, and time. Normal reflexes, muscle tone coordination. No cranial nerve deficit noted. PSYCHIATRIC: Normal mood and affect. Normal behavior. CARDIOVASCULAR: Normal heart rate noted, regular rhythm RESPIRATORY: Effort and breath sounds normal, no problems with respiration noted ABDOMEN: Soft, nontender, nondistended, gravid. Well-healed Pfannenstiel incision. PELVIC: Deferred MUSCULOSKELETAL: Normal range of motion. No edema and no tenderness. 2+ distal pulses.   Pertinent Labs/Studies:   Results for orders placed or performed during the hospital encounter of 07/17/17 (from  the past 72 hour(s))  CBC     Status: Abnormal   Collection Time: 07/17/17 10:50 AM  Result Value Ref Range   WBC 12.2 (H) 4.0 - 10.5 K/uL   RBC 4.15 3.87 - 5.11 MIL/uL   Hemoglobin 13.1 12.0 - 15.0 g/dL   HCT 16.1 09.6 - 04.5 %   MCV 90.8 78.0 - 100.0 fL   MCH 31.6 26.0 - 34.0 pg   MCHC 34.7 30.0 - 36.0 g/dL   RDW 40.9 81.1 - 91.4 %   Platelets 158 150 - 400 K/uL  Rapid HIV screen (HIV 1/2 Ab+Ag)     Status: None   Collection Time: 07/17/17 10:50 AM  Result Value Ref Range   HIV-1 P24 Antigen - HIV24 NON REACTIVE NON REACTIVE   HIV 1/2 Antibodies NON REACTIVE NON  REACTIVE   Interpretation (HIV Ag Ab)      A non reactive test result means that HIV 1 or HIV 2 antibodies and HIV 1 p24 antigen were not detected in the specimen.  RPR     Status: None   Collection Time: 07/17/17 10:50 AM  Result Value Ref Range   RPR Ser Ql Non Reactive Non Reactive    Comment: (NOTE) Performed At: [facility] [address] [place], [place] [id number] Mila Homer MD YQ:[id number]   Type and screen     Status: None   Collection Time: 07/17/17 10:50 AM  Result Value Ref Range   ABO/RH(D) O NEG    Antibody Screen NEG    Sample Expiration 07/20/2017     Assessment and Plan :Katrine Radich is a 31 y.o. G3P2002 at [redacted]w[redacted]d being admitted for repeat scheduled cesarean section. The risks of cesarean section discussed with the patient included but were not limited to: bleeding which may require transfusion or reoperation; infection which may require antibiotics; injury to bowel, bladder, ureters or other surrounding organs; injury to the fetus; need for additional procedures including hysterectomy in the event of a life-threatening hemorrhage; placental abnormalities wth subsequent pregnancies, incisional problems, thromboembolic phenomenon and other postoperative/anesthesia complications. The patient concurred with the proposed plan, giving informed written consent for the procedure. Patient has been NPO since last night she will remain NPO for procedure. Anesthesia and OR aware. Preoperative prophylactic antibiotics and SCDs ordered on call to the OR. To OR when ready.    Rolm Bookbinder, DO OB Fellow Faculty Practice, [facility]

## 2017-07-18 NOTE — Anesthesia Postprocedure Evaluation (Signed)
Anesthesia Post Note  Patient: Patricia Lam  Procedure(s) Performed: CESAREAN SECTION (N/A )     Patient location during evaluation: PACU Anesthesia Type: Spinal Level of consciousness: oriented and awake and alert Pain management: pain level controlled Vital Signs Assessment: post-procedure vital signs reviewed and stable Respiratory status: spontaneous breathing, respiratory function stable and nonlabored ventilation Cardiovascular status: blood pressure returned to baseline and stable Postop Assessment: no headache, no backache, no apparent nausea or vomiting, spinal receding and patient able to bend at knees Anesthetic complications: no    Last Vitals:  Vitals:   07/18/17 1222 07/18/17 1324  BP: 101/61 (!) 112/56  Pulse: 62 62  Resp: 18 19  Temp: 36.6 C 37.1 C  SpO2: 100%     Last Pain:  Vitals:   07/18/17 1325  TempSrc:   PainSc: 0-No pain   Pain Goal:                 Cecile Hearing

## 2017-07-18 NOTE — Addendum Note (Signed)
Addendum  created 07/18/17 1710 by Graciela Husbands, CRNA   Charge Capture section accepted, Sign clinical note

## 2017-07-18 NOTE — Anesthesia Postprocedure Evaluation (Signed)
Anesthesia Post Note  Patient: Patricia Lam  Procedure(s) Performed: CESAREAN SECTION (N/A )     Patient location during evaluation: Mother Baby Anesthesia Type: Spinal Level of consciousness: awake and alert and oriented Pain management: satisfactory to patient Vital Signs Assessment: post-procedure vital signs reviewed and stable Respiratory status: respiratory function stable Cardiovascular status: stable Postop Assessment: no headache, no backache, epidural receding, patient able to bend at knees, no signs of nausea or vomiting and adequate PO intake Anesthetic complications: no    Last Vitals:  Vitals:   07/18/17 1324 07/18/17 1439  BP: (!) 112/56 (!) 108/53  Pulse: 62 68  Resp: 19   Temp: 37.1 C 36.9 C  SpO2:  97%    Last Pain:  Vitals:   07/18/17 1605  TempSrc:   PainSc: 5    Pain Goal:                 Patricia Lam

## 2017-07-18 NOTE — Anesthesia Preprocedure Evaluation (Signed)
Anesthesia Evaluation  Patient identified by MRN, date of birth, ID band Patient awake    Reviewed: Allergy & Precautions, NPO status , Patient's Chart, lab work & pertinent test results  Airway Mallampati: II  TM Distance: >3 FB Neck ROM: Full    Dental  (+) Teeth Intact, Dental Advisory Given   Pulmonary neg pulmonary ROS,    Pulmonary exam normal breath sounds clear to auscultation       Cardiovascular negative cardio ROS Normal cardiovascular exam Rhythm:Regular Rate:Normal     Neuro/Psych negative neurological ROS  negative psych ROS   GI/Hepatic negative GI ROS, Neg liver ROS,   Endo/Other  negative endocrine ROS  Renal/GU negative Renal ROS     Musculoskeletal negative musculoskeletal ROS (+)   Abdominal   Peds  Hematology negative hematology ROS (+) Plt 158k   Anesthesia Other Findings Day of surgery medications reviewed with the patient.  Reproductive/Obstetrics (+) Pregnancy                             Anesthesia Physical Anesthesia Plan  ASA: II  Anesthesia Plan: Spinal   Post-op Pain Management:    Induction:   PONV Risk Score and Plan: 2 and Ondansetron, Dexamethasone and Scopolamine patch - Pre-op  Airway Management Planned:   Additional Equipment:   Intra-op Plan:   Post-operative Plan:   Informed Consent: I have reviewed the patients History and Physical, chart, labs and discussed the procedure including the risks, benefits and alternatives for the proposed anesthesia with the patient or authorized representative who has indicated his/her understanding and acceptance.   Dental advisory given  Plan Discussed with: CRNA, Anesthesiologist and Surgeon  Anesthesia Plan Comments: (Discussed risks and benefits of and differences between spinal and general. Discussed risks of spinal including headache, backache, failure, bleeding, infection, and nerve damage.  Patient consents to spinal. Questions answered. Coagulation studies and platelet count acceptable.  **Spanish interpreter utilized throughout entire patient encounter.**)        Anesthesia Quick Evaluation

## 2017-07-18 NOTE — Transfer of Care (Signed)
Immediate Anesthesia Transfer of Care Note  Patient: Patricia Lam  Procedure(s) Performed: CESAREAN SECTION (N/A )  Patient Location: PACU  Anesthesia Type:Spinal  Level of Consciousness: awake, alert  and oriented  Airway & Oxygen Therapy: Patient Spontanous Breathing  Post-op Assessment: Report given to RN and Post -op Vital signs reviewed and stable  Post vital signs: Reviewed and stable  Last Vitals:  Vitals:   07/18/17 0750  BP: 98/73  Pulse: 96  Resp: 18  Temp: 37.3 C    Last Pain:  Vitals:   07/18/17 0750  TempSrc: Oral         Complications: No apparent anesthesia complications

## 2017-07-18 NOTE — Anesthesia Procedure Notes (Addendum)
Spinal  Patient location during procedure: OR Start time: 07/18/2017 9:12 AM End time: 07/18/2017 9:15 AM Staffing Anesthesiologist: Cecile Hearing Performed: anesthesiologist  Preanesthetic Checklist Completed: patient identified, surgical consent, pre-op evaluation, timeout performed, IV checked, risks and benefits discussed and monitors and equipment checked Spinal Block Patient position: sitting Prep: site prepped and draped and DuraPrep Patient monitoring: continuous pulse ox and blood pressure Approach: midline Location: L3-4 Injection technique: single-shot Needle Needle type: Pencan  Needle gauge: 24 G Assessment Sensory level: T10 Events: failed spinal Additional Notes Spinal placed without incident.  However, patient failed to achieve adequate sensory level after lying patient supine.  Patient with T10 level bilaterally.  Decision made to sit patient up and place second spinal with 1 cc of 0.75% heavy bupivacaine at 0929.  Patient again placed in supine position and achieved adequate sensory level to begin C-section.

## 2017-07-18 NOTE — Op Note (Signed)
Patricia Lam PROCEDURE DATE: 07/18/2017  PREOPERATIVE DIAGNOSES: Intrauterine pregnancy at [redacted]w[redacted]d weeks gestation; previous cesarean section  POSTOPERATIVE DIAGNOSES: The same  PROCEDURE: Repeat Low Transverse Cesarean Section  SURGEON:  Dr. Candelaria Celeste  ASSISTANT:  Dr. Rolm Bookbinder  ANESTHESIOLOGY TEAM: Anesthesiologist: Cecile Hearing, MD CRNA: Elgie Congo, CRNA  INDICATIONS: Patricia Lam is a 31 y.o. [phone] at [redacted]w[redacted]d here for cesarean section secondary to the indications listed under preoperative diagnoses; please see preoperative note for further details.  The risks of cesarean section were discussed with the patient including but were not limited to: bleeding which may require transfusion or reoperation; infection which may require antibiotics; injury to bowel, bladder, ureters or other surrounding organs; injury to the fetus; need for additional procedures including hysterectomy in the event of a life-threatening hemorrhage; placental abnormalities wth subsequent pregnancies, incisional problems, thromboembolic phenomenon and other postoperative/anesthesia complications.   The patient concurred with the proposed plan, giving informed written consent for the procedure.    FINDINGS:  Viable infant in cephalic presentation.  Apgars 9 and 9.  Clear amniotic fluid.  Intact placenta, three vessel cord.  Normal uterus, fallopian tubes and ovaries bilaterally.  ANESTHESIA: Spinal ESTIMATED BLOOD LOSS: 1150 ml URINE OUTPUT:  700 ml SPECIMENS: Placenta sent to L&D COMPLICATIONS: None immediate  PROCEDURE IN DETAIL:  The patient preoperatively received intravenous antibiotics and had sequential compression devices applied to her lower extremities.  She was then taken to the operating room where spinal anesthesia was administered and was found to be adequate. She was then placed in a dorsal supine position with a leftward tilt, and prepped and draped in a sterile  manner.  A foley catheter was placed into her bladder and attached to constant gravity.  After an adequate timeout was performed, a Pfannenstiel skin incision was made with scalpel over her preexisting scar and carried through to the underlying layer of fascia. The fascia was incised in the midline, and this incision was extended bilaterally using the Mayo scissors.  Kocher clamps were applied to the superior aspect of the fascial incision and the underlying rectus muscles were dissected off bluntly.  A similar process was carried out on the inferior aspect of the fascial incision. The rectus muscles were separated in the midline bluntly and the peritoneum was entered bluntly. Attention was turned to the lower uterine segment where a low transverse hysterotomy was made with a scalpel and extended bilaterally bluntly.  The infant was successfully delivered, the cord was clamped and cut after one minute, and the infant was handed over to the awaiting neonatology team. Uterine massage was then administered, and the placenta delivered intact with a three-vessel cord. The placenta was anterior and careful attention was given to placental removal, no evidence of accreta.The uterus was then cleared of clots and debris.  The hysterotomy was closed with 0 Vicryl in a running locked fashion, and an imbricating layer was also placed with 0 Vicryl.  Figure-of-eight 0 Vicryl serosal stitches were placed to help with hemostasis.  The pelvis was cleared of all clot and debris. Hemostasis was confirmed on all surfaces.  The peritoneum was closed with a 0 Vicryl running stitch. The fascia was then closed using 0 Vicryl in a running fashion. The skin was closed with a 4-0 Vicryl subcuticular stitch. The patient tolerated the procedure well. Sponge, lap, instrument and needle counts were correct x 3.  She was taken to the recovery room in stable condition.    Rolm Bookbinder, DO Faculty  Practice, [place]

## 2017-07-19 LAB — RH IG WORKUP (INCLUDES ABO/RH)
ABO/RH(D): O NEG
FETAL SCREEN: NEGATIVE
Gestational Age(Wks): 39
UNIT DIVISION: 0

## 2017-07-19 LAB — CBC
HEMATOCRIT: 26.9 % — AB (ref 36.0–46.0)
Hemoglobin: 9.4 g/dL — ABNORMAL LOW (ref 12.0–15.0)
MCH: 32 pg (ref 26.0–34.0)
MCHC: 34.9 g/dL (ref 30.0–36.0)
MCV: 91.5 fL (ref 78.0–100.0)
PLATELETS: 124 10*3/uL — AB (ref 150–400)
RBC: 2.94 MIL/uL — ABNORMAL LOW (ref 3.87–5.11)
RDW: 13.6 % (ref 11.5–15.5)
WBC: 13.9 10*3/uL — ABNORMAL HIGH (ref 4.0–10.5)

## 2017-07-19 LAB — BIRTH TISSUE RECOVERY COLLECTION (PLACENTA DONATION)

## 2017-07-19 MED ORDER — BUPIVACAINE IN DEXTROSE 0.75-8.25 % IT SOLN
INTRATHECAL | Status: AC
  Filled 2017-07-19: qty 2

## 2017-07-19 NOTE — Lactation Note (Signed)
Lactation Consultation Note  Patient Name: Patricia GiovanniMaria Lam PIRJJ'OToday's Date: 07/19/2017 Reason for consult: Follow-up assessment   Maternal Data Has patient been taught Hand Expression?: Yes (LC reviewed with mom and several drops noted ) Does the patient have breastfeeding experience prior to this delivery?: Yes  Feeding Feeding Type: Breast Fed Length of feed: 15 min (multiple swallows )  LATCH Score Latch: Grasps breast easily, tongue down, lips flanged, rhythmical sucking.  Audible Swallowing: Spontaneous and intermittent  Type of Nipple: Everted at rest and after stimulation  Comfort (Breast/Nipple): Soft / non-tender  Hold (Positioning): Assistance needed to correctly position infant at breast and maintain latch.  LATCH Score: 9  Interventions Interventions: Breast feeding basics reviewed;Assisted with latch;Skin to skin;Breast massage;Hand express;Adjust position;Support pillows;Position options;Breast compression  Lactation Tools Discussed/Used WIC Program: Yes ([facility]   Consult Status Consult Status: Follow-up Date: 07/20/17 Follow-up type: In-patient    [name] 07/19/2017, 2:39 PM

## 2017-07-19 NOTE — Progress Notes (Signed)
Post Partum Day 1 Subjective: no complaints, up ad lib, voiding and tolerating PO  Objective: Blood pressure 103/61, pulse 66, temperature 98.6 F (37 C), temperature source Oral, resp. rate 18, height 5\' 5"  (1.651 m), weight 188 lb (85.3 kg), last menstrual period 10/24/2016, SpO2 97 %, unknown if currently breastfeeding.  Physical Exam:  General: alert, cooperative and no distress Lochia: appropriate Uterine Fundus: firm Incision: healing well, no significant drainage, no dehiscence, no significant erythema DVT Evaluation: No evidence of DVT seen on physical exam. No cords or calf tenderness.   Recent Labs  07/17/17 1050 07/19/17 0515  HGB 13.1 9.4*  HCT 37.7 26.9*    Assessment/Plan: Possible Plan for discharge tomorrow, Breastfeeding and Contraception [place]   LOS: 1 day   Arlyce Harmanimothy Keimari [place] 07/19/2017, 7:20 AM

## 2017-07-19 NOTE — Lactation Note (Signed)
Lactation Consultation Note  Patient Name: Patricia GiovanniMaria Sanchez-Vazquez ZOXWR'UToday's Date: 07/19/2017 Reason for consult: Follow-up assessment  LC called [name] ( spanish interpreter ) and was told she wasn't able to  Come at this point and to call [name] , [name] #[id number] [place]  Baby is 7831 hours old, and per mom has breast- fed only x 2 in the last 24 hours  And it was on nights.  Baby awake and hungry even though fed last at 1115 27 ml and had void/ stool. Baby had another large void and greenish mec stool.  Baby showing feeding cues , and LC offered to assist with latch and mom receptive.  Baby Latched on the right breast / football/ and fed for 15 mins with depth and multiple swallows  And increased with breast compressions.  Nipple well rounded when baby released.  LC encouraged mom to give the baby practice at the breast , and after finishing 1st breast , offer the 2nd breast  Prior to supplementing.  LC discussed supply and demand, and the importance of stimulating the breast consistently to get the milk to come in.  Mother informed of post-discharge support and given phone number to the lactation department, including services for phone call assistance; out-patient appointments; and breastfeeding support group. List of other breastfeeding resources in the community given in the handout. Encouraged mother to call for problems or concerns related to breastfeeding.  Per mom signed up with WIC this am.   Maternal Data Has patient been taught Hand Expression?: Yes (LC reviewed with mom and several drops noted ) Does the patient have breastfeeding experience prior to this delivery?: Yes  Feeding Feeding Type: Breast Fed Length of feed: 15 min (multiple swallows )  LATCH Score Latch: Grasps breast easily, tongue down, lips flanged, rhythmical sucking.  Audible Swallowing: Spontaneous and intermittent  Type of Nipple: Everted at rest and after stimulation  Comfort (Breast/Nipple): Soft /  non-tender  Hold (Positioning): Assistance needed to correctly position infant at breast and maintain latch.  LATCH Score: 9  Interventions Interventions: Breast feeding basics reviewed;Assisted with latch;Skin to skin;Breast massage;Hand express;Adjust position;Support pillows;Position options;Breast compression  Lactation Tools Discussed/Used WIC Program: Yes (per mom [facility]   Consult Status Consult Status: Follow-up Date: 07/20/17 Follow-up type: In-patient    Matilde SprangMargaret Ann Sephiroth Mcluckie 07/19/2017, 2:16 PM

## 2017-07-20 LAB — CBC
HCT: 27.5 % — ABNORMAL LOW (ref 36.0–46.0)
Hemoglobin: 9.6 g/dL — ABNORMAL LOW (ref 12.0–15.0)
MCH: 32.3 pg (ref 26.0–34.0)
MCHC: 34.9 g/dL (ref 30.0–36.0)
MCV: 92.6 fL (ref 78.0–100.0)
Platelets: 128 10*3/uL — ABNORMAL LOW (ref 150–400)
RBC: 2.97 MIL/uL — ABNORMAL LOW (ref 3.87–5.11)
RDW: 14 % (ref 11.5–15.5)
WBC: 11.5 10*3/uL — AB (ref 4.0–10.5)

## 2017-07-20 MED ORDER — SENNOSIDES-DOCUSATE SODIUM 8.6-50 MG PO TABS: 2.0000 | ORAL_TABLET | Freq: Every day | ORAL | 0 refills | Status: DC

## 2017-07-20 MED ORDER — IBUPROFEN 600 MG PO TABS: 600.0000 mg | ORAL_TABLET | Freq: Four times a day (QID) | ORAL | 1 refills | Status: DC

## 2017-07-20 MED ORDER — TRAMADOL HCL 50 MG PO TABS: 50.0000 mg | ORAL_TABLET | Freq: Four times a day (QID) | ORAL | 0 refills | Status: AC | PRN

## 2017-07-20 NOTE — Discharge Instructions (Signed)
Cuidados en el postparto luego de un parto por cesárea  (Postpartum Care After Cesarean Delivery)  El período de tiempo que sigue inmediatamente al parto se conoce como puerperio.  ¿QUÉ TIPO DE ATENCIÓN MÉDICA RECIBIRÉ?  · Podría continuar recibiendo medicamentos y líquidos través de una vía intravenosa (IV) que se colocará en una de sus venas.  · Probablemente tenga colocado un tubo flexible de drenaje (catéter) que drenará la orina de la vejiga. El catéter será retirado tan pronto como sea apropiado.  · Es posible que le den una botella rociadora para que use cuando vaya al baño. Puede utilizarla hasta que se sienta cómoda limpiándose de la manera habitual. Siga los pasos a continuación para usar la botella rociadora:  ? Antes de orinar, llene la botella rociadora con agua tibia. El agua debe estar tibia. No use agua caliente.  ? Después de orinar, mientras aún está sentada en el inodoro, use la botella rociadora para enjuagar el área alrededor de la uretra y la abertura vaginal. Con esto podrá limpiar cualquier rastro de orina y sangre.  ? Puede hacer esto en lugar de secarse. Cuando comience a sanar, podrá usar la botella rociadora antes de secarse. Asegúrese de secarse suavemente.  ? Llene la botella rociadora con agua limpia cada vez que vaya al baño.  · Deberá usar apósitos sanitarios.  · Le controlarán la incisión para asegurarse de que esté cicatrizando correctamente. Le indicarán cuando es seguro retirar los puntos, las grapas o la cinta adhesiva para la piel.  ¿QUÉ PUEDO ESPERAR?  · Quizás no tenga necesidad de orinar durante varias horas después del parto.  · Sentirá algo de dolor y molestias en el abdomen. Puede tener una pequeña secreción de sangre o de líquido transparente que proviene de la incisión.  · Si está amamantando, podría tener contracciones uterinas cada vez que lo haga. Estas podrían prolongarse hasta varias semanas durante el puerperio. Las contracciones uterinas ayudan al útero a  regresar a su tamaño habitual.  · Es normal tener un poco de hemorragia vaginal (loquios) después del parto. La cantidad y apariencia de los loquios a menudo es similar a las del período menstrual la primera semana después del parto. Disminuirá gradualmente las siguientes semanas hasta convertirse en una descarga seca amarronada o amarillenta. En la mayoría de las mujeres, los loquios se detienen completamente entre 6 a 8 semanas después del parto. Los sangrados vaginales pueden variar de mujer a mujer.  · Los primeros días después del parto, podría padecer congestión mamaria. Los pechos se sentirán pesados, llenos y molestos. Las mamas también podrían latir y ponerse duras, muy tirantes, calientes y sensibles al tacto. Cuando esto ocurra, podría notar leche que se escapa de los senos. El médico puede recomendarle algunos métodos para aliviar este malestar causado por la congestión mamaria. La congestión mamaria debería desaparecer al cabo de unos días.  · Podría sentirse más deprimida o preocupada que lo habitual debido a los cambios hormonales luego del parto. Estos sentimientos no deben durar más de unos pocos días. Si no desaparecen al cabo de algunos días, hable con su médico.  ¿QUÉ CUIDADOS DEBO TENER?  · Infórmele a su médico si siente dolor o malestar.  · Beba suficiente agua para mantener la orina clara o de color amarillo pálido.  · Lávese bien las manos con agua y jabón durante al menos 20 segundos después de cambiar el apósito sanitario, usar el baño y antes de sostener o alimentar al bebé.  · Si no está amamantando, evite tocarse   mucho los senos. Al hacerlo, podrían producir más leche.  · Si se siente débil o mareada, o si siente que está a punto de desmayarse, pida ayuda antes de realizar lo siguiente:  ? Levantarse de la cama.  ? Ducharse.  · Cambie los apósitos sanitarios con frecuencia. Observe si hay cambios en el flujo, como un aumento repentino en el volumen, cambios en el color o coágulos  sanguíneos de gran tamaño. Si expulsa un coágulo sanguíneo por la vagina, guárdelo para mostrárselo a su médico. No tire la cadena sin que el médico examine el coágulo antes.  · Asegúrese de tener todas las vacunas al día. Esto la ayudará a estar protegida y a proteger al bebé de determinadas enfermedades. Podría necesitar vacunas antes de dejar el hospital.  · Si lo desea, hable con el médico acerca de los métodos de planificación familiar o control de la natalidad (métodos anticonceptivos).  ¿CÓMO PUEDO ESTABLECER LAZOS CON MI BEBÉ?  Pasar tanto tiempo como le sea posible con el bebé es sumamente importante. Durante ese tiempo, usted y su bebé pueden conocerse y desarrollar lazos. Tener al bebé con usted en la habitación le dará tiempo de conocerlo. Esto también puede hacerla sentir más cómoda para atender al bebé. Amamantar también puede ayudarla a crear lazos con el bebé.  ¿CÓMO PUEDO PLANIFICAR MI REGRESO A CASA CON EL BEBÉ?  · Asegúrese de tener instalada una butaca en el automóvil.  ? La butaca debe contar con la certificación del fabricante para asegurarse de que esté instalada en forma segura.  ? Asegúrese de que el bebé quede bien asegurado en la butaca.  · Pregúntele al médico todo lo que necesite saber sobre los cuidados de su bebé. Asegúrese de poder comunicarse con el médico en caso de que tenga preguntas luego de dejar el hospital.  Esta información no tiene como fin reemplazar el consejo del médico. Asegúrese de hacerle al médico cualquier pregunta que tenga.  Document Released: 09/05/2012 Document Revised: 01/11/2016 Document Reviewed: 08/24/2015  Elsevier Interactive Patient Education © 2018 Elsevier Inc.

## 2017-07-20 NOTE — Discharge Summary (Signed)
OB Discharge Summary     Patient Name: Patricia Lam DOB: Feb 05, 1986 MRN: [id number]  Date of admission: 07/18/2017 Delivering MD: Levie Heritage   Date of discharge: 07/20/2017  Admitting diagnosis: RCS Intrauterine pregnancy: [redacted]w[redacted]d     Secondary diagnosis:  Active Problems:   Status post repeat low transverse cesarean section  Additional problems: hx thrombocytopenia- stable     Discharge diagnosis: Term Pregnancy Delivered                                                                                                Post partum procedures:none  Augmentation: N/A  Complications: None  Hospital course:  Sceduled C/S   31 y.o. yo G3P3003 at [redacted]w[redacted]d was admitted to the hospital 07/18/2017 for scheduled cesarean section with the following indication:Elective Repeat.  Membrane Rupture Time/Date: 9:57 AM ,07/18/2017   Patient delivered a Viable infant.07/18/2017  Details of operation can be found in separate operative note.  Pateint had an uncomplicated postpartum course.  She is ambulating, tolerating a regular diet, passing flatus, and urinating well. Patient is discharged home in stable condition on  07/20/17. On CBCs x 3 while inpt her platelets ranged from 128-158.         Physical exam  Vitals:   07/19/17 0607 07/19/17 0900 07/19/17 1807 07/20/17 0520  BP: 103/61 109/65 120/72 (!) 100/55  Pulse: 66 74 74 72  Resp: 18  18 18   Temp:  98.2 F (36.8 C) 98.6 F (37 C) (!) 97.5 F (36.4 C)  TempSrc:    Oral  SpO2:      Weight:      Height:       General: alert, cooperative and no distress Lochia: appropriate Uterine Fundus: firm Incision: Healing well with no significant drainage, No significant erythema, Dressing is clean, dry, and intact DVT Evaluation: No evidence of DVT seen on physical exam. No cords or calf tenderness. Labs: Lab Results  Component Value Date   WBC 11.5 (H) 07/20/2017   HGB 9.6 (L) 07/20/2017   HCT 27.5 (L) 07/20/2017   MCV 92.6  07/20/2017   PLT 128 (L) 07/20/2017   No flowsheet data found.  Discharge instruction: per After Visit Summary and "Baby and Me Booklet".  After visit meds:  Allergies as of 07/20/2017   No Known Allergies     Medication List    TAKE these medications   ibuprofen 600 MG tablet Commonly known as:  ADVIL,MOTRIN Take 1 tablet (600 mg total) by mouth every 6 (six) hours.   PRENATAL VITAMIN PO Take 1 tablet by mouth daily.   senna-docusate 8.6-50 MG tablet Commonly known as:  Senokot-S Take 2 tablets by mouth at bedtime.   traMADol 50 MG tablet Commonly known as:  ULTRAM Take 1 tablet (50 mg total) by mouth every 6 (six) hours as needed for moderate pain.            Discharge Care Instructions        Start     Ordered   07/20/17 0000  Change dressing (specify)    Comments:  Dressing change:  You may shower with the dressing on. Please leave in place for 5-7 days. It will start to come off on its on. If it is still in place after 7 days you may remove all dressing and strips.   07/20/17 0730      Diet: routine diet  Activity: Advance as tolerated. Pelvic rest for 6 weeks.   Outpatient follow up:4 weeks Follow up Appt:No future appointments. Follow up Visit:No Follow-up on file.  Postpartum contraception: IUD Paragard  Newborn Data: Live born female  Birth Weight: 7 lb 1.6 oz (3221 g) APGAR: 9, 9  Newborn Delivery   Birth date/time:  07/18/2017 09:57:00 Delivery type:  C-Section, Low Transverse  C-section categorization:  Repeat     Baby Feeding: Breast Disposition:home with mother   07/20/2017 Patricia Harmanimothy Lockamy, DO  CNM attestation I have seen and examined this patient and agree with above documentation in the resident's note.   Patricia Lam is a 31 y.o. G3P3003 s/p rLTCS.   Pain is well controlled.  Plan for birth control is IUD.  Method of Feeding: breast  PE:  BP (!) 100/55 (BP Location: Left Arm)   Pulse 72   Temp (!) 97.5 F  (36.4 C) (Oral) Comment: just drank water  Resp 18   Ht 5\' 5"  (1.651 m)   Wt 85.3 kg (188 lb)   LMP 10/24/2016 (Approximate)   SpO2 97%   Breastfeeding? Unknown   BMI 31.28 kg/m  Fundus firm   Recent Labs  07/19/17 0515 07/20/17 0459  HGB 9.4* 9.6*  HCT 26.9* 27.5*     Plan: discharge today - postpartum care discussed - f/u clinic in 4 weeks for postpartum visit   Cam HaiSHAW, KIMBERLY, CNM 9:25 AM 07/20/2017

## 2020-06-29 ENCOUNTER — Encounter (HOSPITAL_COMMUNITY): Payer: Self-pay

## 2020-06-29 ENCOUNTER — Emergency Department (HOSPITAL_COMMUNITY)
Admission: EM | Admit: 2020-06-29 | Discharge: 2020-06-29 | Disposition: A | Discharge status: Home / Self Care | Payer: Self-pay | Attending: Emergency Medicine | Admitting: Emergency Medicine

## 2020-06-29 DIAGNOSIS — Z5321 Procedure and treatment not carried out due to patient leaving prior to being seen by health care provider: Secondary | ICD-10-CM | POA: Insufficient documentation

## 2020-06-29 DIAGNOSIS — R300 Dysuria: Secondary | ICD-10-CM | POA: Insufficient documentation

## 2020-06-29 LAB — URINALYSIS, ROUTINE W REFLEX MICROSCOPIC
Bilirubin Urine: NEGATIVE
Glucose, UA: NEGATIVE mg/dL
Ketones, ur: NEGATIVE mg/dL
Nitrite: POSITIVE — AB
Protein, ur: NEGATIVE mg/dL
Specific Gravity, Urine: 1.005 (ref 1.005–1.030)
pH: 5 (ref 5.0–8.0)

## 2020-06-29 NOTE — ED Triage Notes (Signed)
Pt has been having hesitancy and dysuria since yesterday.

## 2020-06-29 NOTE — ED Notes (Signed)
Urine culture sent down with u/a 

## 2020-09-23 ENCOUNTER — Other Ambulatory Visit: Payer: Self-pay

## 2020-09-23 DIAGNOSIS — N644 Mastodynia: Secondary | ICD-10-CM

## 2020-10-15 ENCOUNTER — Other Ambulatory Visit: Payer: Self-pay

## 2020-10-15 ENCOUNTER — Ambulatory Visit
Admission: RE | Admit: 2020-10-15 | Discharge: 2020-10-15 | Disposition: A | Discharge status: Home / Self Care | Payer: No Typology Code available for payment source | Source: Ambulatory Visit | Attending: Obstetrics and Gynecology | Admitting: Obstetrics and Gynecology

## 2020-10-15 ENCOUNTER — Ambulatory Visit: Payer: No Typology Code available for payment source | Admitting: *Deleted

## 2020-10-15 VITALS — BP 124/76 | Wt 200.1 lb

## 2020-10-15 DIAGNOSIS — Z1239 Encounter for other screening for malignant neoplasm of breast: Secondary | ICD-10-CM

## 2020-10-15 DIAGNOSIS — N644 Mastodynia: Secondary | ICD-10-CM

## 2020-10-15 DIAGNOSIS — N6314 Unspecified lump in the right breast, lower inner quadrant: Secondary | ICD-10-CM

## 2020-10-15 NOTE — Patient Instructions (Signed)
Explained breast self awareness with [name]. Patient did not need a Pap smear today due to last Pap smear and HPV typing was 11/20/2018. Let her know BCCCP will cover Pap smears and HPV typing every 5 years unless has a history of abnormal Pap smears. Referred patient to the [facility] for a diagnostic mammogram. Appointment scheduled Thursday, October 15, 2020 at 1400. Patient aware of appointment and will be there. Patricia Lam verbalized understanding.  Avaneesh Pepitone, Kathaleen Maser, RN 1:00 PM

## 2020-10-15 NOTE — Progress Notes (Signed)
Ms. Almeter Westhoff is a 35 y.o. female who presents to [facility] clinic today with complaint of bilateral breast pain x 6-7 months that comes and goes. Patient rates the pain at a 5 out of 10. Patient stated she feels a firm hard area bilateral inner breast.    Pap Smear: Pap smear not completed today. Last Pap smear was 11/20/2018 at the [facility] clinic and was normal with negative HPV. Per patient has no history of an abnormal Pap smear. Last Pap smear result is available in Epic.   Physical exam: Breasts Breasts symmetrical. No skin abnormalities bilateral breasts. No nipple retraction bilateral breasts. No nipple discharge bilateral breasts. No lymphadenopathy. No lumps palpated left breast. Palpated a bb sized lump within the right breast at 4 o'clock 4 cm from the nipple. Complaints of bilateral outer breast tenderness on exam.   Pelvic/Bimanual Pap is not indicated today per BCCCP guidelines.   Smoking History: Patient has never smoked.   Patient Navigation: Patient education provided. Access to services provided for patient through [place] program. Spanish interpreter Natale Lay from [facility] provided.   Breast and Cervical Cancer Risk Assessment: Patient does not have family history of breast cancer, known genetic mutations, or radiation treatment to the chest before age 14. Patient does not have history of cervical dysplasia, immunocompromised, or DES exposure in-utero. Breast cancer risk assessment completed. No breast cancer risk calculated due to patient is less than 13 years old.  Risk Assessment    Risk Scores      10/15/2020   Last edited by: Narda Rutherford, LPN   5-year risk:    Lifetime risk:          A: BCCCP exam without pap smear Complaint of bilateral outer breast pain.  P: Referred patient to the [facility] for a diagnostic mammogram. Appointment scheduled Thursday, October 15, 2020 at 1400.  Priscille Heidelberg, RN 10/15/2020 1:00 PM

## 2022-07-10 IMAGING — MG DIGITAL DIAGNOSTIC BILAT W/ TOMO W/ CAD
8 of 14 series · 8 of 40 positions shown · non-contrast
Comparison: None.

CLINICAL DATA: 34-year-old female presenting for evaluation of
focal pain in the right breast.

EXAM:
DIGITAL DIAGNOSTIC BILATERAL MAMMOGRAM WITH TOMO AND CAD; ULTRASOUND
RIGHT BREAST LIMITED

[L MLO synth-2D]
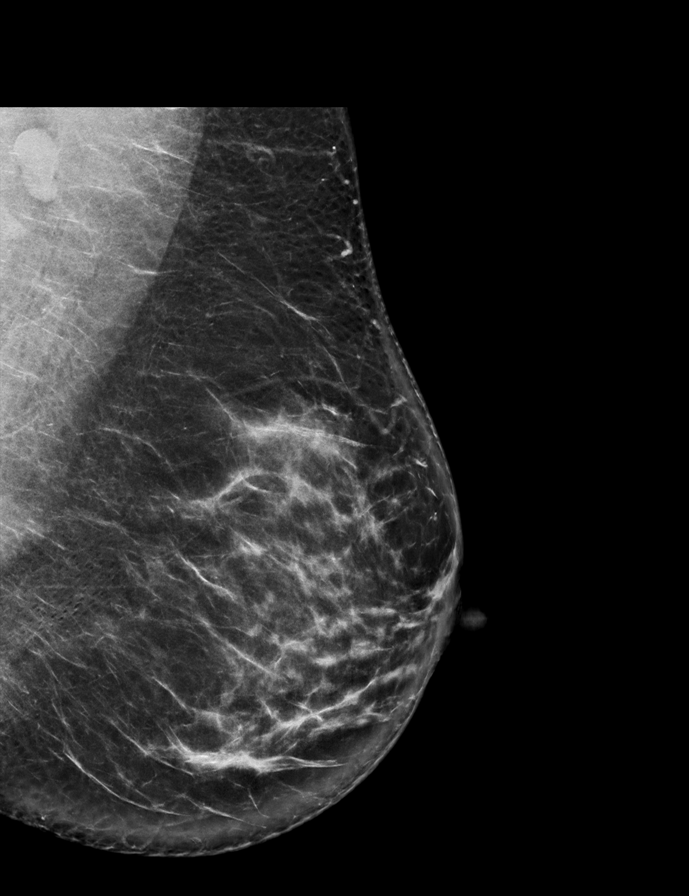

[R CC synth-2D (1 of 3)]
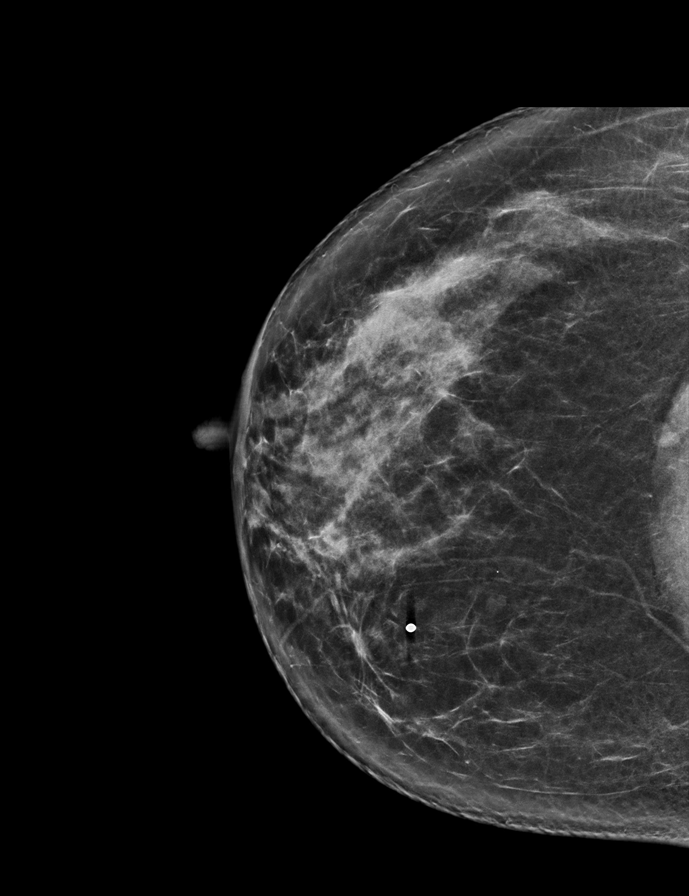

[R CC synth-2D (2 of 3)]
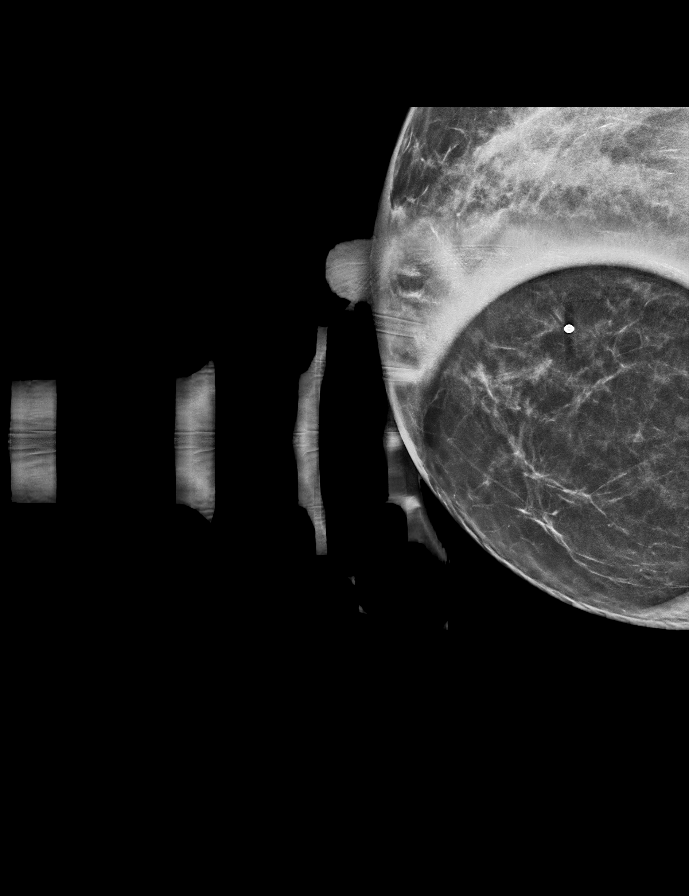

[L CC synth-2D]
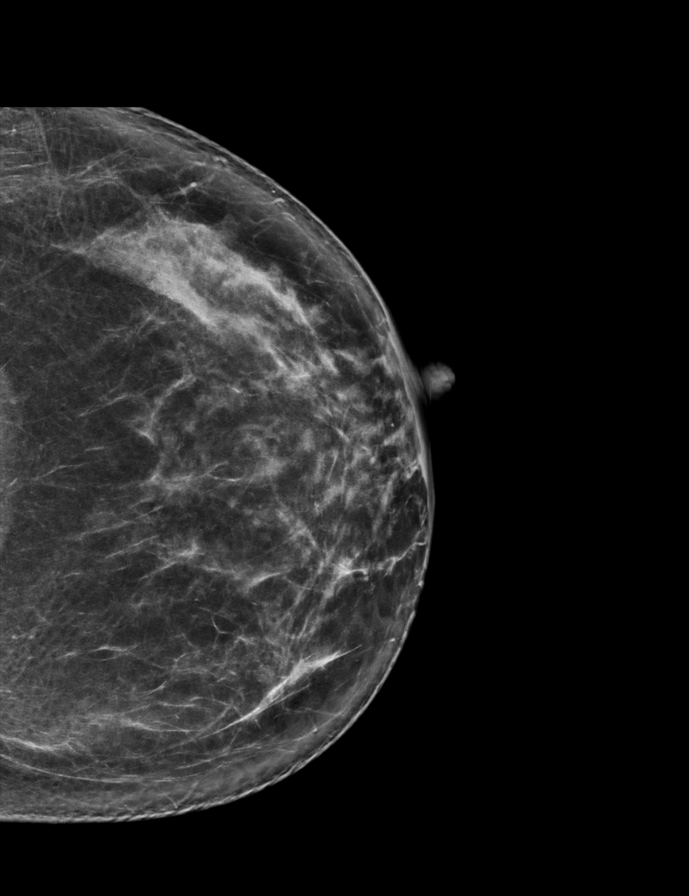

[R CC synth-2D (3 of 3)]
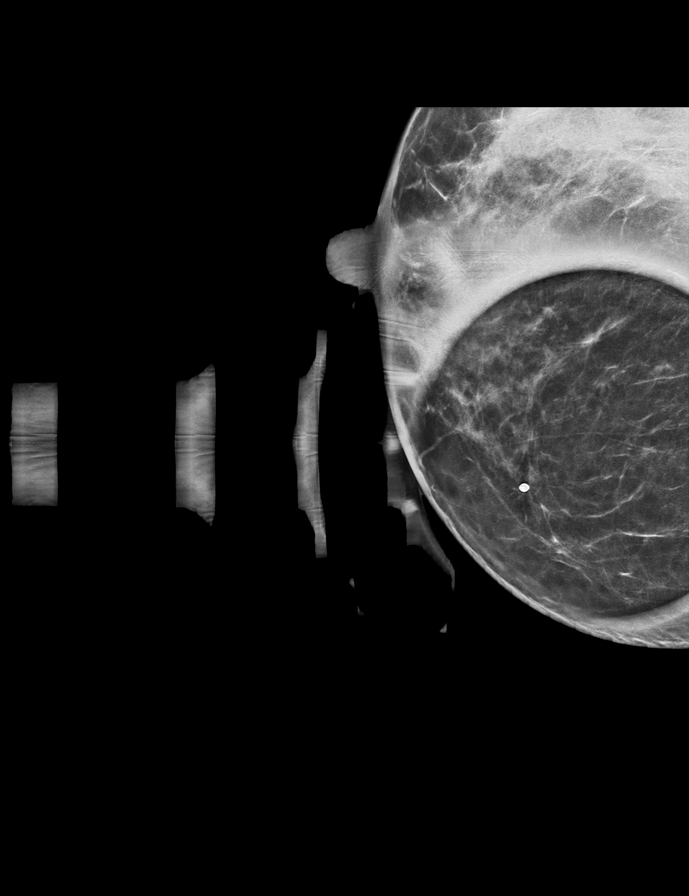

[R MLO synth-2D]
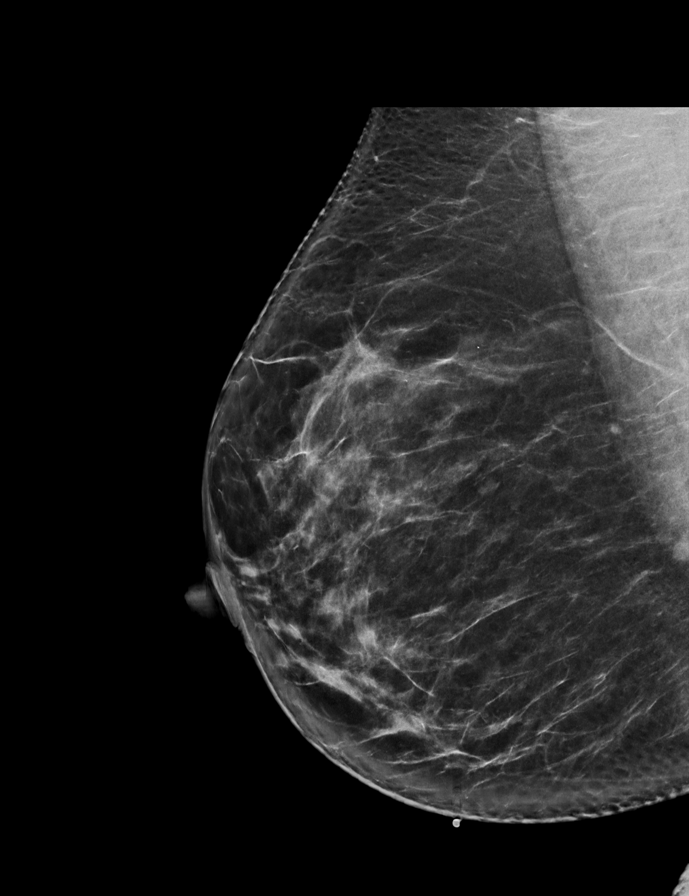

[R TAN synth-2D]
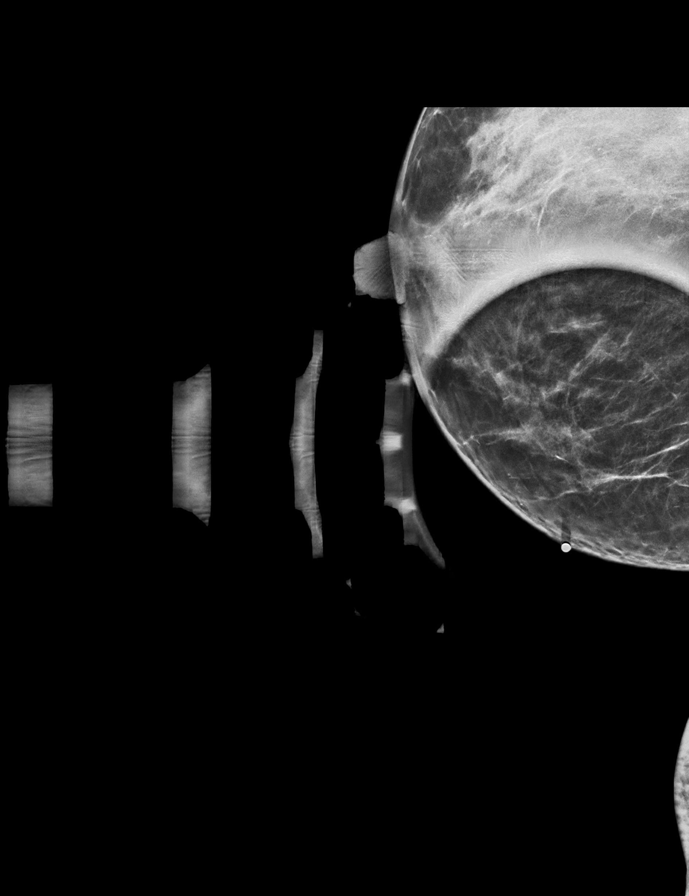

[R MLO tomo · tomo slice 40/79.0]
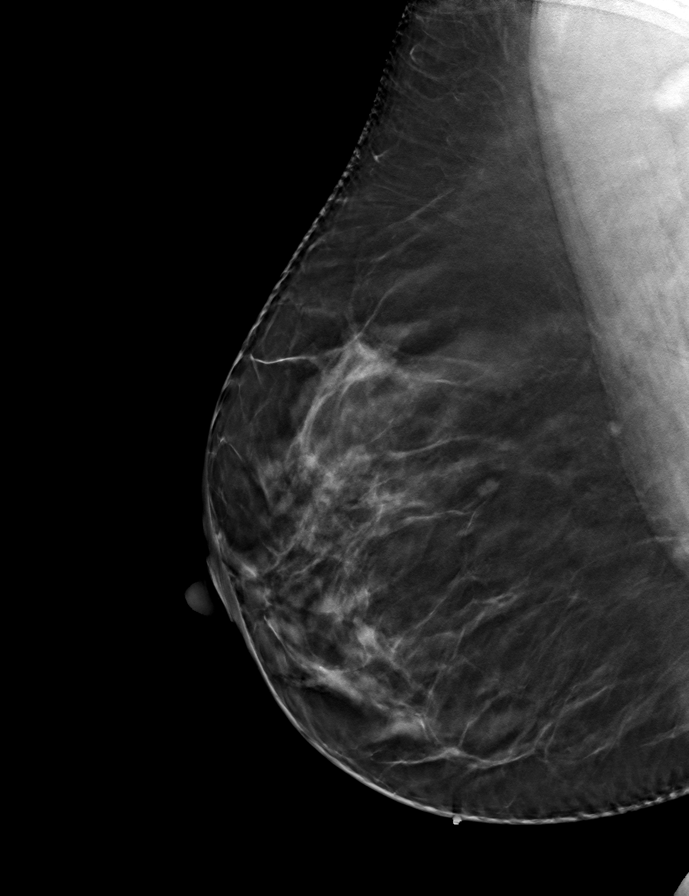

[8 of 40 positions shown; findings below may reference images not displayed]

ACR Breast Density Category c: The breast tissue is heterogeneously
dense, which may obscure small masses.
FINDINGS: A BB has been placed on the lower inner quadrant of the right breast
at the site of focal pain. There is an asymmetry projecting anterior
to the skin marker on the CC view, without a definite correlate on
the lateral view. No other suspicious calcifications, masses or
areas of distortion are seen in the bilateral breasts.

Mammographic images were processed with CAD.

Physical exam targeted to the palpable site in the lower-inner right
breast demonstrates no discrete palpable masses.

Ultrasound targeted to the area of concern in the lower-inner
quadrant of the right breast demonstrates normal fibroglandular
tissue. No suspicious masses or areas of shadowing are identified.
IMPRESSION: 1. There are no suspicious mammographic or targeted sonographic
abnormalities at the palpable site of concern in the lower-inner
right breast.

2.  No mammographic evidence of malignancy in the bilateral breasts.

RECOMMENDATION:
1. Clinical follow-up recommended for the palpable area of concern
in the right breast. Any further workup should be based on clinical
grounds.

2. Screening mammogram at age 40 unless there are persistent or
intervening clinical concerns. (Code:SX-3-SYG)

I have discussed the findings and recommendations with the patient.
If applicable, a reminder letter will be sent to the patient
regarding the next appointment.

BI-RADS CATEGORY  1: Negative.

## 2023-01-27 LAB — CYTOLOGY - PAP: Pap: NEGATIVE

## 2023-10-23 ENCOUNTER — Encounter: Payer: Self-pay | Admitting: Family Medicine

## 2023-10-23 ENCOUNTER — Other Ambulatory Visit: Payer: Self-pay

## 2023-10-23 ENCOUNTER — Ambulatory Visit (INDEPENDENT_AMBULATORY_CARE_PROVIDER_SITE_OTHER): Payer: Self-pay | Admitting: Family Medicine

## 2023-10-23 VITALS — BP 108/77 | HR 73 | Ht 65.0 in | Wt 205.0 lb

## 2023-10-23 DIAGNOSIS — O3680X0 Pregnancy with inconclusive fetal viability, not applicable or unspecified: Secondary | ICD-10-CM | POA: Diagnosis not present

## 2023-10-23 DIAGNOSIS — Z3A10 10 weeks gestation of pregnancy: Secondary | ICD-10-CM | POA: Diagnosis not present

## 2023-10-23 LAB — POCT PREGNANCY, URINE: Preg Test, Ur: POSITIVE — AB

## 2023-10-23 NOTE — Progress Notes (Signed)
Here for pregnancy test which was positive. She reports sure, normal LMP of 08/14/23. This makes her [redacted]w[redacted]d with EDD 05/20/24. Offered dating [place] which she accepted and scheduled for 10/25/23 at 7am for check in. Hx C/S x3 with hx thrombocytopenia. She is taking PNV. Advised to start prenatal care asap. She denies any other health issues. Dr. Lucianne Muss in to welcome back to practice. Sent to front desk to schedule ob appointments.  Nancy Fetter

## 2023-10-25 ENCOUNTER — Other Ambulatory Visit: Payer: Self-pay | Admitting: Family Medicine

## 2023-10-25 ENCOUNTER — Ambulatory Visit (HOSPITAL_COMMUNITY)
Admission: RE | Admit: 2023-10-25 | Discharge: 2023-10-25 | Disposition: A | Discharge status: Home / Self Care | Payer: No Typology Code available for payment source | Source: Ambulatory Visit | Attending: Family Medicine | Admitting: Family Medicine

## 2023-10-25 ENCOUNTER — Other Ambulatory Visit (HOSPITAL_COMMUNITY): Admission: RE | Admit: 2023-10-25 | Payer: No Typology Code available for payment source | Source: Ambulatory Visit

## 2023-10-25 ENCOUNTER — Ambulatory Visit (INDEPENDENT_AMBULATORY_CARE_PROVIDER_SITE_OTHER): Payer: Self-pay

## 2023-10-25 ENCOUNTER — Inpatient Hospital Stay (HOSPITAL_COMMUNITY)
Admission: RE | Admit: 2023-10-25 | Payer: No Typology Code available for payment source | Source: Home / Self Care | Admitting: Family Medicine

## 2023-10-25 ENCOUNTER — Other Ambulatory Visit (HOSPITAL_COMMUNITY)
Admission: RE | Admit: 2023-10-25 | Discharge: 2023-10-25 | Disposition: A | Discharge status: Home / Self Care | Payer: No Typology Code available for payment source | Source: Ambulatory Visit | Attending: Family Medicine | Admitting: Family Medicine

## 2023-10-25 VITALS — BP 117/65 | HR 75

## 2023-10-25 DIAGNOSIS — O0991 Supervision of high risk pregnancy, unspecified, first trimester: Secondary | ICD-10-CM

## 2023-10-25 DIAGNOSIS — O0990 Supervision of high risk pregnancy, unspecified, unspecified trimester: Secondary | ICD-10-CM | POA: Insufficient documentation

## 2023-10-25 DIAGNOSIS — O3680X0 Pregnancy with inconclusive fetal viability, not applicable or unspecified: Secondary | ICD-10-CM | POA: Insufficient documentation

## 2023-10-25 DIAGNOSIS — Z3A10 10 weeks gestation of pregnancy: Secondary | ICD-10-CM

## 2023-10-25 DIAGNOSIS — O09521 Supervision of elderly multigravida, first trimester: Secondary | ICD-10-CM

## 2023-10-25 DIAGNOSIS — O09529 Supervision of elderly multigravida, unspecified trimester: Secondary | ICD-10-CM | POA: Insufficient documentation

## 2023-10-25 LAB — GC/CHLAMYDIA PROBE AMP (~~LOC~~) NOT AT ARMC
Chlamydia: NEGATIVE
Comment: NEGATIVE
Comment: NORMAL
Neisseria Gonorrhea: NEGATIVE

## 2023-10-25 NOTE — Progress Notes (Signed)
New OB Intake  I connected with [name]  on 10/25/23 at  9:15 AM EST by InPerson Visit and verified that I am speaking with the correct person using two identifiers. Nurse is located at [facility] and pt is located at [facility].  I discussed the limitations, risks, security and privacy concerns of performing an evaluation and management service by telephone and the availability of in person appointments. I also discussed with the patient that there may be a patient responsible charge related to this service. The patient expressed understanding and agreed to proceed.  I explained I am completing New OB Intake today. We discussed EDD of Not found.. Pt is [id number]. I reviewed her allergies, medications and Medical/Surgical/OB history.    Patient Active Problem List   Diagnosis Date Noted   Supervision of high risk pregnancy, antepartum 10/25/2023   AMA (advanced maternal age) multigravida 35+ 10/25/2023   Status post repeat low transverse cesarean section 07/18/2017    Concerns addressed today  Delivery Plans Plans to deliver at [facility]. Discussed the nature of our practice with multiple providers including residents and students. Due to the size of the practice, the delivering provider may not be the same as those providing prenatal care.   Patient is not interested in water birth. Offered upcoming OB visit with CNM to discuss further.  MyChart/Babyscripts MyChart access verified. I explained pt will have some visits in office and some virtually. Babyscripts instructions given and order placed. Patient verifies receipt of registration text/e-mail. Account successfully created and app downloaded. If patient is a candidate for Optimized scheduling, add to sticky note.   Blood Pressure Cuff/Weight Scale Patient is self-pay; explained patient will be given BP cuff at first prenatal appt. Explained after first prenatal appt pt will check weekly and document in Babyscripts. Patient does  have weight scale.  Anatomy US Explained first scheduled [place] will be around 19 weeks. Anatomy US scheduled for 12/25/23 at 0815a .  Is patient a CenteringPregnancy candidate?     If accepted,    Is patient a Mom+Baby Combined Care candidate?  Not a candidate   If accepted, confirm patient does not intend to move from the area for at least 12 months, then notify Mom+Baby staff  Interested in [place]? If yes, send referral and doula dot phrase.   Is patient a candidate for Babyscripts Optimization?    First visit review I reviewed new OB appt with patient. Explained pt will be seen by Dr.Eckstat at first visit. Discussed Avelina Laine genetic screening with patient. done [employer].. Routine prenatal labs  collected today.    Last Pap Pap-BCCCP  Henrietta Dine, CMA 10/25/2023  1:13 PM

## 2023-10-26 ENCOUNTER — Encounter: Payer: Self-pay | Admitting: Family Medicine

## 2023-10-26 DIAGNOSIS — O26899 Other specified pregnancy related conditions, unspecified trimester: Secondary | ICD-10-CM | POA: Insufficient documentation

## 2023-10-26 LAB — CBC/D/PLT+RPR+RH+ABO+RUBIGG...
Antibody Screen: NEGATIVE
Basophils Absolute: 0 10*3/uL (ref 0.0–0.2)
Basos: 0 %
EOS (ABSOLUTE): 0 10*3/uL (ref 0.0–0.4)
Eos: 0 %
HCV Ab: NONREACTIVE
HIV Screen 4th Generation wRfx: NONREACTIVE
Hematocrit: 40.9 % (ref 34.0–46.6)
Hemoglobin: 13.7 g/dL (ref 11.1–15.9)
Hepatitis B Surface Ag: NEGATIVE
Immature Grans (Abs): 0 10*3/uL (ref 0.0–0.1)
Immature Granulocytes: 0 %
Lymphocytes Absolute: 2.5 10*3/uL (ref 0.7–3.1)
Lymphs: 30 %
MCH: 29.7 pg (ref 26.6–33.0)
MCHC: 33.5 g/dL (ref 31.5–35.7)
MCV: 89 fL (ref 79–97)
Monocytes Absolute: 0.4 10*3/uL (ref 0.1–0.9)
Monocytes: 4 %
Neutrophils Absolute: 5.4 10*3/uL (ref 1.4–7.0)
Neutrophils: 66 %
Platelets: 266 10*3/uL (ref 150–450)
RBC: 4.62 x10E6/uL (ref 3.77–5.28)
RDW: 12.3 % (ref 11.7–15.4)
RPR Ser Ql: NONREACTIVE
Rh Factor: NEGATIVE
Rubella Antibodies, IGG: 2.7 {index} (ref >0.99)
WBC: 8.3 10*3/uL (ref 3.4–10.8)

## 2023-10-26 LAB — HCV INTERPRETATION

## 2023-10-26 LAB — HEMOGLOBIN A1C
Est. average glucose Bld gHb Est-mCnc: 108 mg/dL
Hgb A1c MFr Bld: 5.4 % (ref 4.8–5.6)

## 2023-10-27 LAB — CULTURE, OB URINE

## 2023-10-27 LAB — URINE CULTURE, OB REFLEX

## 2023-11-02 ENCOUNTER — Ambulatory Visit (INDEPENDENT_AMBULATORY_CARE_PROVIDER_SITE_OTHER): Payer: Self-pay | Admitting: Family Medicine

## 2023-11-02 ENCOUNTER — Encounter: Payer: Self-pay | Admitting: Family Medicine

## 2023-11-02 VITALS — BP 123/81 | HR 75 | Wt 204.7 lb

## 2023-11-02 DIAGNOSIS — Z1332 Encounter for screening for maternal depression: Secondary | ICD-10-CM

## 2023-11-02 DIAGNOSIS — Z3A09 9 weeks gestation of pregnancy: Secondary | ICD-10-CM

## 2023-11-02 DIAGNOSIS — Z98891 History of uterine scar from previous surgery: Secondary | ICD-10-CM

## 2023-11-02 DIAGNOSIS — Z6791 Unspecified blood type, Rh negative: Secondary | ICD-10-CM

## 2023-11-02 DIAGNOSIS — O26891 Other specified pregnancy related conditions, first trimester: Secondary | ICD-10-CM | POA: Diagnosis not present

## 2023-11-02 DIAGNOSIS — Z3009 Encounter for other general counseling and advice on contraception: Secondary | ICD-10-CM | POA: Insufficient documentation

## 2023-11-02 DIAGNOSIS — O30043 Twin pregnancy, dichorionic/diamniotic, third trimester: Secondary | ICD-10-CM

## 2023-11-02 DIAGNOSIS — O09521 Supervision of elderly multigravida, first trimester: Secondary | ICD-10-CM

## 2023-11-02 DIAGNOSIS — O30041 Twin pregnancy, dichorionic/diamniotic, first trimester: Secondary | ICD-10-CM

## 2023-11-02 DIAGNOSIS — O0991 Supervision of high risk pregnancy, unspecified, first trimester: Secondary | ICD-10-CM

## 2023-11-02 DIAGNOSIS — O0990 Supervision of high risk pregnancy, unspecified, unspecified trimester: Secondary | ICD-10-CM

## 2023-11-02 DIAGNOSIS — O30049 Twin pregnancy, dichorionic/diamniotic, unspecified trimester: Secondary | ICD-10-CM | POA: Insufficient documentation

## 2023-11-02 MED ORDER — ASPIRIN 81 MG PO TBEC: 81.0000 mg | DELAYED_RELEASE_TABLET | Freq: Every day | ORAL | 12 refills | Status: DC

## 2023-11-02 MED ORDER — BLOOD PRESSURE KIT DEVI: 1.0000 | Freq: Once | 0 refills | Status: AC

## 2023-11-02 NOTE — Patient Instructions (Signed)
Opciones de mtodos anticonceptivos Birth Control Options Los mtodos anticonceptivos tambin se denominan anticonceptivos. Los anticonceptivos previenen Firefighter. Hay muchos tipos de anticonceptivos. Trabaje con el mdico para encontrar la opcin ms adecuada para usted. Anticonceptivos que [place] hormonas Estos tipos de anticonceptivos contienen hormonas para Neurosurgeon. Implante anticonceptivo Este es un pequeo tubo que se coloca dentro de la piel del brazo. El tubo Insurance claims handler colocado durante 3 aos como mximo. Inyeccin anticonceptiva Son inyecciones que se aplican cada 3 meses. Pldoras anticonceptivas Esta es una pldora que se toma todos [place]. Debe tomarla a la [employer]. Parche anticonceptivo Este es un parche que se coloca sobre la piel. Se debe cambiar 1 vez por semana durante 3 semanas. Despus de [employer], el parche se debe retirar durante 1 semana. Anillo vaginal  Este es un anillo de plstico blando que se coloca dentro de la vagina. El anillo se deja colocado durante 3 semanas. Luego, se debe retirar durante 1 semana. Despus se coloca un nuevo anillo. Mtodos de barrera  Preservativo masculino Es una cubierta delgada que se coloca sobre el pene antes de [place]. El preservativo se desecha despus de Doctor, hospital. Preservativo femenino Es una cubierta blanda y suelta que se coloca en la vagina antes de [place]. El preservativo se desecha despus de Doctor, hospital. Diafragma El diafragma es una barrera blanda con forma de tazn. Debe estar hecho para adaptarse a su cuerpo. Se coloca en la vagina antes de tener sexo con una sustancia qumica que destruye los espermatozoides llamada espermicida. El Designer, fashion/clothing en la vagina durante 6 a 8 horas despus de tener sexo y debe retirarse en un plazo de 24 horas. El diafragma se debe reemplazar: Cada 1 o 2 aos. Despus de dar a luz. Despus de aumentar ms de 15 libras (6.8  kg). Si se somete a una ciruga en la pelvis. Capuchn cervical Este es un capuchn pequeo y [place] se fija sobre el cuello uterino. El cuello uterino es la parte ms baja del tero. Se coloca en la vagina antes del sexo, junto con un espermicida. El capuchn debe fabricarse para usted. El capuchn se debe dejar colocado durante 6 a 8 horas despus del sexo. Se debe retirar en un plazo de 48 horas. El capuchn cervical debe ser recetado y adaptado a su cuerpo por un mdico. Debe reemplazarse cada 2 aos. Esponja Esta es una esponja pequea que se coloca en la vagina antes de [place]. Se debe dejar colocada durante al menos 6 horas despus de [employer]. Se debe retirar en un plazo de 30 horas y desecharse. Espermicidas Son sustancias qumicas que destruyen o impiden que los espermatozoides ingresen al tero. Se pueden presentar en forma de pldora, crema, gel o espuma que se debe colocar en la vagina. Se deben usar al menos de 10 a 15 minutos antes de [employer]. Dispositivo intrauterino Un dispositivo intrauterino (DIU) es un dispositivo que un mdico coloca dentro del tero. Existen dos tipos: DIU hormonal. Este tipo puede permanecer colocado durante 3 a 5 aos. DIU de cobre. Este tipo Insurance claims handler colocado durante 10 aos. Mtodos anticonceptivos permanentes Ligadura de trompas en la mujer Es una ciruga para obstruir las trompas de [place]. Esterilizacin masculina Es una ciruga, llamada vasectoma, para ligar los conductos que transportan los espermatozoides en los hombres. Este mtodo funciona al cabo de 3 meses. Se deben usar otros mtodos anticonceptivos durante 3 meses. Mtodos de planificacin  natural Esto significa no tener [employer] la pareja femenina podra quedar embarazada. A continuacin se mencionan algunos mtodos anticonceptivos por planificacin natural: Usar un calendario a fin de: Hacer un seguimiento de la duracin de cada ciclo  menstrual. Determinar en [employer] se podra producir Firefighter. Planificar no tener [employer] en que se podra producir Firefighter. Reconocer los signos de la ovulacin y no tener relaciones sexuales durante ese perodo. La pareja femenina puede detectar cundo ser la ovulacin haciendo un seguimiento de su temperatura todos [place]. Tambin puede examinar si hay cambios en la mucosidad que proviene del cuello uterino. Dnde obtener ms informacin Centers for Disease Control and Prevention (Centros para el Control y la Prevencin de Enfermedades): [email or web address]. Luego: Introduzca "birth control" o "anticonceptivos" en el cuadro de bsqueda. Esta informacin no tiene Theme park manager el consejo del mdico. Asegrese de hacerle al mdico cualquier pregunta que tenga. Document Revised: 05/26/2023 Document Reviewed: 05/26/2023 Elsevier Patient Education  2024 [employer].

## 2023-11-02 NOTE — Addendum Note (Signed)
Addended by: Maxwell Marion E on: 11/02/2023 11:47 AM   Modules accepted: Orders

## 2023-11-02 NOTE — Progress Notes (Signed)
Subjective:   Patricia Lam is a 38 y.o. [phone] at [redacted]w[redacted]d by early ultrasound being seen today for her first obstetrical visit.  Her obstetrical history is significant for  3 prior cesareans, di/ditwin gestation, AMA, Rh neg . Patient does intend to breast feed. Pregnancy history fully reviewed.  Patient reports no complaints.  HISTORY: OB History  Gravida Para Term Preterm AB Living  4 3 3  0 0 3  SAB IAB Ectopic Multiple Live Births  0 0 0 0 3    # Outcome Date GA Lbr Len/2nd Weight Sex Type Anes PTL Lv  4 Current           3 Term 07/18/17 [redacted]w[redacted]d  7 lb 1.6 oz (3.221 kg) F CS-LTranv Spinal  LIV     Birth Comments: wnl     Name: SANCHEZ-VAZQUEZ,GIRL Levia     Apgar1: 9  Apgar5: 9  2 Term 2011    M CS-LTranv   LIV  1 Term 06/15/05     CS-LTranv        Last pap smear: No results found for: "DIAGPAP", "HPV", "HPVHIGH" *[facility] from [facility], reports she had one last year*  Past Medical History:  Diagnosis Date   Medical history non-contributory    Past Surgical History:  Procedure Laterality Date   CESAREAN SECTION     CESAREAN SECTION N/A 07/18/2017   Procedure: CESAREAN SECTION;  Surgeon: Levie Heritage, DO;  [facility];  Service: Obstetrics;  Laterality: N/A;   Family History  Problem Relation Age of Onset   Hypertension Father    Hypertension Brother    Obesity Brother    Social History   Tobacco Use   Smoking status: Never   Smokeless tobacco: Never  Vaping Use   Vaping status: Never Used  Substance Use Topics   Alcohol use: No   Drug use: No   No Known Allergies Current Outpatient Medications on File Prior to Visit  Medication Sig Dispense Refill   Prenatal Vit-Fe Fumarate-FA (PRENATAL VITAMIN PO) Take 1 tablet by mouth daily.      No current facility-administered medications on file prior to visit.     Exam   Vitals:   11/02/23 1046  BP: 123/81  Pulse: 75  Weight: 204 lb 11.2 oz (92.9 kg)      System: General:  well-developed, well-nourished female in no acute distress   Skin: normal coloration and turgor, no rashes   Neurologic: oriented, normal, negative, normal mood   Extremities: normal strength, tone, and muscle mass, ROM of all joints is normal   HEENT PERRLA, extraocular movement intact and sclera clear, anicteric   Neck supple and no masses   Respiratory:  no respiratory distress      Assessment:   Pregnancy: [id number] Patient Active Problem List   Diagnosis Date Noted   Dichorionic diamniotic twin gestation 11/02/2023   Rh negative state in antepartum period 10/26/2023   Supervision of high risk pregnancy, antepartum 10/25/2023   AMA (advanced maternal age) multigravida 35+ 10/25/2023   Status post repeat low transverse cesarean section 07/18/2017     Plan:  1. Supervision of high risk pregnancy, antepartum (Primary) BP normal, FHR appear normal on bedside US Initial labs reviewed and are normal Will draw Panorama/Horizon today Ultrasound discussed; fetal anatomic survey: ordered. Problem list reviewed and updated. The nature of [place] [facility] with multiple MDs and other Advanced Practice Providers was explained to patient; also emphasized  that residents, students are part of our team.  2. Dichorionic diamniotic twin pregnancy in third trimester Confirmed on 8 week [place] Patient unaware of results, notified today in clinic and confirmed on bedside US Start ASA - aspirin EC 81 MG tablet; Take 1 tablet (81 mg total) by mouth daily. Swallow whole.  Dispense: 30 tablet; Refill: 12  3. Rh negative state in antepartum period Antibody screen negative on new OB labs Will order Rh status of fetuses with Panorama  4. Status post repeat low transverse cesarean section X3, for RCS Discussed BTL, she would like this done Will sign consent a little on the early side given twin gestation  5. Multigravida of advanced maternal age in first trimester Start  ASA - aspirin EC 81 MG tablet; Take 1 tablet (81 mg total) by mouth daily. Swallow whole.  Dispense: 30 tablet; Refill: 12    Routine obstetric precautions reviewed. Return in 4 weeks (on 11/30/2023) for [facility], ob visit.    Venora Maples, MD/MPH Attending Family Medicine Physician, [facility] for [facility], [facility]

## 2023-11-06 ENCOUNTER — Encounter: Payer: Self-pay | Admitting: *Deleted

## 2023-11-06 LAB — PANORAMA PRENATAL TEST FULL PANEL:PANORAMA TEST PLUS 5 ADDITIONAL MICRODELETIONS

## 2023-11-08 NOTE — Progress Notes (Signed)
 Received notification from Dr Ilona Malta to resubmit Panorama as a twin gestation.  [employer] Change form faxed and should receive updated results.   Teaghan Melrose,RN  11/08/23

## 2023-11-09 ENCOUNTER — Encounter: Payer: Self-pay | Admitting: Family Medicine

## 2023-11-09 LAB — PANORAMA PRENATAL TEST FULL PANEL:PANORAMA TEST PLUS 5 ADDITIONAL MICRODELETIONS
TRISOMY 21 AGE-BASED RISK TEXT: 3.5
TRISOMY 21 RESULT TEXT: 3.9

## 2023-11-14 LAB — HORIZON CUSTOM: REPORT SUMMARY: NEGATIVE

## 2023-11-15 DIAGNOSIS — O0990 Supervision of high risk pregnancy, unspecified, unspecified trimester: Secondary | ICD-10-CM

## 2023-11-30 ENCOUNTER — Ambulatory Visit: Payer: Medicaid Other | Admitting: Family Medicine

## 2023-11-30 ENCOUNTER — Encounter: Payer: Self-pay | Admitting: Family Medicine

## 2023-11-30 VITALS — BP 120/79 | HR 107 | Wt 204.0 lb

## 2023-11-30 DIAGNOSIS — O30041 Twin pregnancy, dichorionic/diamniotic, first trimester: Secondary | ICD-10-CM

## 2023-11-30 DIAGNOSIS — O0990 Supervision of high risk pregnancy, unspecified, unspecified trimester: Secondary | ICD-10-CM

## 2023-11-30 DIAGNOSIS — O0991 Supervision of high risk pregnancy, unspecified, first trimester: Secondary | ICD-10-CM

## 2023-11-30 DIAGNOSIS — Z98891 History of uterine scar from previous surgery: Secondary | ICD-10-CM | POA: Diagnosis not present

## 2023-11-30 DIAGNOSIS — O09521 Supervision of elderly multigravida, first trimester: Secondary | ICD-10-CM

## 2023-11-30 DIAGNOSIS — Z6791 Unspecified blood type, Rh negative: Secondary | ICD-10-CM

## 2023-11-30 DIAGNOSIS — Z3009 Encounter for other general counseling and advice on contraception: Secondary | ICD-10-CM

## 2023-11-30 DIAGNOSIS — Z3A13 13 weeks gestation of pregnancy: Secondary | ICD-10-CM

## 2023-11-30 DIAGNOSIS — O26891 Other specified pregnancy related conditions, first trimester: Secondary | ICD-10-CM

## 2023-11-30 DIAGNOSIS — O30043 Twin pregnancy, dichorionic/diamniotic, third trimester: Secondary | ICD-10-CM

## 2023-11-30 DIAGNOSIS — O09522 Supervision of elderly multigravida, second trimester: Secondary | ICD-10-CM

## 2023-11-30 MED ORDER — ASPIRIN 81 MG PO TBEC: 81.0000 mg | DELAYED_RELEASE_TABLET | Freq: Every day | ORAL | 12 refills | Status: DC

## 2023-11-30 MED ORDER — BLOOD PRESSURE KIT DEVI: 1.0000 | Freq: Once | 0 refills | Status: AC

## 2023-11-30 NOTE — Progress Notes (Signed)
   PRENATAL VISIT NOTE  Subjective:  Patricia Lam is a 38 y.o. [phone] at [redacted]w[redacted]d being seen today for ongoing prenatal care.  She is currently monitored for the following issues for this high-risk pregnancy and has Status post repeat low transverse cesarean section; Supervision of high risk pregnancy, antepartum; AMA (advanced maternal age) multigravida 35+; Rh negative state in antepartum period; Dichorionic diamniotic twin gestation; and Unwanted fertility on their problem list.  Patient reports nausea.  Contractions: Not present. Vag. Bleeding: None.  Movement: Absent. Denies leaking of fluid.   The following portions of the patient's history were reviewed and updated as appropriate: allergies, current medications, past family history, past medical history, past social history, past surgical history and problem list.   Objective:   Vitals:   11/30/23 1143  BP: 120/79  Pulse: (!) 107  Weight: 204 lb (92.5 kg)    Fetal Status: Fetal Heart Rate (bpm): 143/137   Movement: Absent     General:  Alert, oriented and cooperative. Patient is in no acute distress.  Skin: Skin is warm and dry. No rash noted.   Cardiovascular: Normal heart rate noted  Respiratory: Normal respiratory effort, no problems with respiration noted  Abdomen: Soft, gravid, appropriate for gestational age.  Pain/Pressure: Absent     Pelvic: Cervical exam deferred        Extremities: Normal range of motion.  Edema: None  Mental Status: Normal mood and affect. Normal behavior. Normal judgment and thought content.   Assessment and Plan:  Pregnancy: [id number] at [redacted]w[redacted]d 1. Supervision of high risk pregnancy, antepartum (Primary) - Blood Pressure Monitoring (BLOOD PRESSURE KIT) DEVI; 1 Device by Does not apply route once for 1 dose. (Patient not taking: Reported on 11/30/2023)  Dispense: 1 each; Refill: 0 - [place] MFM OB DETAIL ADDL GEST +14 WK; Future  2. Status post repeat low transverse cesarean section For RCS and  BTL  3. Rh negative state in antepartum period Will need Rhogam @ 28 weeks and pp if indicated  4. Dichorionic diamniotic twin pregnancy in third trimester ASA sent in Anatomy u/s scheduled x 2 - aspirin EC 81 MG tablet; Take 1 tablet (81 mg total) by mouth daily. Swallow whole. (Patient not taking: Reported on 11/30/2023)  Dispense: 30 tablet; Refill: 12 - [place] MFM OB DETAIL ADDL GEST +14 WK; Future  5. Multigravida of advanced maternal age in second trimester LR NIPT x 2  6. Unwanted fertility To sign BTL papers soon  7. Multigravida of advanced maternal age in first trimester  - aspirin EC 81 MG tablet; Take 1 tablet (81 mg total) by mouth daily. Swallow whole. (Patient not taking: Reported on 11/30/2023)  Dispense: 30 tablet; Refill: 12  8. [redacted] weeks gestation of pregnancy   9. Language barrier Spanish interpreter: video used  General obstetric precautions including but not limited to vaginal bleeding, contractions, leaking of fluid and fetal movement were reviewed in detail with the patient. Please refer to After Visit Summary for other counseling recommendations.   Return in 3 weeks (on 12/21/2023) for [facility].  Future Appointments  Date Time Provider Department Center  12/25/2023  8:15 AM [facility] NURSE [facility]  12/25/2023  8:30 AM [facility] US1 [facility]  12/28/2023  9:15 AM Reva Bores, MD [facility]    Reva Bores, MD

## 2023-12-02 ENCOUNTER — Ambulatory Visit
Admission: EM | Admit: 2023-12-02 | Discharge: 2023-12-02 | Disposition: A | Discharge status: Home / Self Care | Attending: Physician Assistant | Admitting: Physician Assistant

## 2023-12-02 DIAGNOSIS — Z3A13 13 weeks gestation of pregnancy: Secondary | ICD-10-CM | POA: Diagnosis not present

## 2023-12-02 DIAGNOSIS — L0501 Pilonidal cyst with abscess: Secondary | ICD-10-CM | POA: Diagnosis not present

## 2023-12-02 MED ORDER — MUPIROCIN 2 % EX OINT: 1.0000 | TOPICAL_OINTMENT | Freq: Two times a day (BID) | CUTANEOUS | 0 refills | Status: DC

## 2023-12-02 MED ORDER — CEPHALEXIN 500 MG PO CAPS: 500.0000 mg | ORAL_CAPSULE | Freq: Four times a day (QID) | ORAL | 0 refills | Status: DC

## 2023-12-02 NOTE — ED Provider Notes (Addendum)
 [facility]    CSN: [id number] Arrival date & time: 12/02/23  0816      History   Chief Complaint Chief Complaint  Patient presents with   Skin Problem    HPI Patricia Lam is a 38 y.o. female.   Patient presents today with a 3-day history of enlarging painful lesion on her left superior gluteal cleft.  She is Spanish-speaking and video interpreter was utilized during visit.  She denies history of pilonidal abscess or similar symptoms in the past.  She has been applying warm compresses but not taking any medication as she is currently [redacted] weeks pregnant.  She denies any fever, nausea, vomiting.  Reports pain is rated 8 on a 0-10 pain scale, described as pressure, no alleviating factors notified.  She denies any recent antibiotics.  Denies any recent surgical procedure.  Denies history of recurrent skin infections or MRSA.    Past Medical History:  Diagnosis Date   Medical history non-contributory     Patient Active Problem List   Diagnosis Date Noted   Dichorionic diamniotic twin gestation 11/02/2023   Unwanted fertility 11/02/2023   Rh negative state in antepartum period 10/26/2023   Supervision of high risk pregnancy, antepartum 10/25/2023   AMA (advanced maternal age) multigravida 35+ 10/25/2023   Status post repeat low transverse cesarean section 07/18/2017    Past Surgical History:  Procedure Laterality Date   CESAREAN SECTION     CESAREAN SECTION N/A 07/18/2017   Procedure: CESAREAN SECTION;  Surgeon: Levie Heritage, DO;  [facility];  Service: Obstetrics;  Laterality: N/A;    OB History     Gravida  4   Para  3   Term  3   Preterm  0   AB  0   Living  3      SAB  0   IAB  0   Ectopic  0   Multiple  0   Live Births  3            Home Medications    Prior to Admission medications   Medication Sig Start Date End Date Taking? Authorizing Provider  cephALEXin (KEFLEX) 500 MG capsule Take 1  capsule (500 mg total) by mouth 4 (four) times daily. 12/02/23  Yes Larsen Zettel, Denny Peon K, PA-C  mupirocin ointment (BACTROBAN) 2 % Apply 1 Application topically 2 (two) times daily. 12/02/23  Yes Creedence Kunesh, Noberto Retort, PA-C  aspirin EC 81 MG tablet Take 1 tablet (81 mg total) by mouth daily. Swallow whole. Patient not taking: Reported on 11/30/2023 11/30/23   Reva Bores, MD  Prenatal Vit-Fe Fumarate-FA (PRENATAL VITAMIN PO) Take 1 tablet by mouth daily.     [provider]    Family History Family History  Problem Relation Age of Onset   Hypertension Father    Hypertension Brother    Obesity Brother     Social History Social History   Tobacco Use   Smoking status: Never   Smokeless tobacco: Never  Vaping Use   Vaping status: Never Used  Substance Use Topics   Alcohol use: No   Drug use: No     Allergies   Patient has no known allergies.   Review of Systems Review of Systems  Constitutional:  Positive for activity change. Negative for appetite change, fatigue and fever.  Gastrointestinal:  Negative for abdominal pain, diarrhea, nausea and vomiting.  Musculoskeletal:  Negative for arthralgias and myalgias.  Skin:  Positive for color  change and wound.  Neurological:  Negative for dizziness, light-headedness and headaches.     Physical Exam Triage Vital Signs ED Triage Vitals  Encounter Vitals Group     BP 12/02/23 0843 112/70     Systolic BP Percentile --      Diastolic BP Percentile --      Pulse Rate 12/02/23 0843 80     Resp 12/02/23 0843 18     Temp 12/02/23 0843 99 F (37.2 C)     Temp Source 12/02/23 0843 Oral     SpO2 12/02/23 0843 98 %     Weight 12/02/23 0840 204 lb (92.5 kg)     Height 12/02/23 0840 5\' 5"  (1.651 m)     Head Circumference --      Peak Flow --      Pain Score 12/02/23 0840 9     Pain Loc --      Pain Education --      Exclude from Growth Chart --    No data found.  Updated Vital Signs BP 112/70 (BP Location: Left Arm)   Pulse 80    Temp 99 F (37.2 C) (Oral)   Resp 18   Ht 5\' 5"  (1.651 m)   Wt 204 lb (92.5 kg)   LMP 08/14/2023   SpO2 98%   BMI 33.95 kg/m   Visual Acuity Right Eye Distance:   Left Eye Distance:   Bilateral Distance:    Right Eye Near:   Left Eye Near:    Bilateral Near:     Physical Exam Vitals reviewed.  Constitutional:      General: She is awake. She is not in acute distress.    Appearance: Normal appearance. She is well-developed. She is not ill-appearing.     Comments: Very pleasant female appears stated age in no acute distress  HENT:     Head: Normocephalic and atraumatic.  Cardiovascular:     Rate and Rhythm: Normal rate and regular rhythm.     Heart sounds: Normal heart sounds, S1 normal and S2 normal. No murmur heard. Pulmonary:     Effort: Pulmonary effort is normal.     Breath sounds: Normal breath sounds. No wheezing, rhonchi or rales.  Abdominal:     General: Bowel sounds are normal.     Palpations: Abdomen is soft.     Tenderness: There is no abdominal tenderness. There is no right CVA tenderness, left CVA tenderness, guarding or rebound.  Skin:    Findings: Abscess present.     Comments: 4 cm x 2 cm abscess with central fluctuance noted left superior gluteal cleft.  No streaking or evidence of lymphangitis.  No active bleeding or drainage noted.  Psychiatric:        Behavior: Behavior is cooperative.      UC Treatments / Results  Labs (all labs ordered are listed, but only abnormal results are displayed) Labs Reviewed - No data to display  EKG   Radiology No results found.  Procedures Incision and Drainage  Date/Time: 12/02/2023 10:31 AM  Performed by: Jeani Hawking, PA-C Authorized by: Jeani Hawking, PA-C   Consent:    Consent obtained:  Verbal   Consent given by:  Patient   Risks discussed:  Bleeding, incomplete drainage, infection and pain   Alternatives discussed:  Observation, referral and alternative treatment Universal protocol:     Procedure explained and questions answered to patient or proxy's satisfaction: yes     Patient identity confirmed:  Verbally with patient Location:    Type:  Pilonidal cyst   Size:  4 cm x 2 cm   Location:  Anogenital   Anogenital location:  Pilonidal Pre-procedure details:    Skin preparation:  Chlorhexidine with alcohol Sedation:    Sedation type:  None Anesthesia:    Anesthesia method:  Local infiltration   Local anesthetic:  Lidocaine 1% w/o epi Procedure type:    Complexity:  Complex Procedure details:    Ultrasound guidance: no     Needle aspiration: no     Incision types:  Stab incision   Incision depth:  Dermal   Wound management:  Probed and deloculated and irrigated with saline   Drainage:  Purulent and bloody   Drainage amount:  Moderate   Wound treatment:  Wound left open   Packing materials:  None Post-procedure details:    Procedure completion:  Tolerated  (including critical care time)  Medications Ordered in UC Medications - No data to display  Initial Impression / Assessment and Plan / UC Course  I have reviewed the triage vital signs and the nursing notes.  Pertinent labs & imaging results that were available during my care of the patient were reviewed by me and considered in my medical decision making (see chart for details).     Patient is well-appearing, afebrile, nontoxic, nontachycardic.  This was successfully drained in clinic.  See procedure note above.  Patient tolerated this procedure without complication.  She reported that her pain improved from 8/9 to 1/2 following the procedure.  Lidocaine without epi was used given her current pregnancy.  Patient is very concerned about taking a medication that could impact the pregnancy.  We discussed risks and benefits of several medications including Augmentin, Bactrim DS, cephalexin.  She preferred to try cephalexin even though does not have MRSA coverage as she denies any significant risk factors for MRSA  is hopeful that this will resolve with I&D alone.  We discussed that she should follow-up closely with her primary care may need to consider additional antibiotics if this does not continue to improve.  We discussed that if anything changes or worsens and she has recurrent swelling, increasing pain, fever, nausea, vomiting, change in character of drainage she should be seen emergently.  Strict return precautions were given.  All questions were answered to patient satisfaction.  Final Clinical Impressions(s) / UC Diagnoses   Final diagnoses:  Pilonidal cyst with abscess  [redacted] weeks gestation of pregnancy     Discharge Instructions      We were able to drain this in clinic.  I am glad that you are feeling better.  Use Tylenol for pain relief.  Start cephalexin 4 times daily for 5 days.  Keep this area clean with soap and water and apply Bactroban ointment with dressing changes.  Continue with warm compresses to encourage drainage.  If anything changes and it becomes larger, more painful, you develop fever, nausea, vomiting you need to be seen immediately.  Follow-up with your primary care for recheck within a week.  Pudimos drenar esto en la clnica.  Me alegro que te sientas mejor.  Utilice Tylenol para Engineer, materials.  Comience con cefalexina 4 veces al da durante 5 [address]  Mantenga esta rea limpia con agua y [place] y aplique ungento Bactroban con los cambios de vendaje.  Contine con compresas tibias para estimular el drenaje.  Si algo cambia y se vuelve ms grande, ms doloroso, presenta fiebre, nuseas o vmitos, debe  ser atendido de inmediato.  Haga un seguimiento con su atencin primaria para volver a controlarlo dentro de Teacher, English as a foreign language.     ED Prescriptions     Medication Sig Dispense Auth. Provider   cephALEXin (KEFLEX) 500 MG capsule Take 1 capsule (500 mg total) by mouth 4 (four) times daily. 20 capsule Hajer Dwyer K, PA-C   mupirocin ointment (BACTROBAN) 2 % Apply 1 Application  topically 2 (two) times daily. 22 g Najae Rathert K, PA-C      PDMP not reviewed this encounter.   Jeani Hawking, PA-C 12/02/23 1031    RaspetNoberto Retort, PA-C 12/02/23 1032

## 2023-12-02 NOTE — Discharge Instructions (Addendum)
 We were able to drain this in clinic.  I am glad that you are feeling better.  Use Tylenol for pain relief.  Start cephalexin 4 times daily for 5 days.  Keep this area clean with soap and water and apply Bactroban ointment with dressing changes.  Continue with warm compresses to encourage drainage.  If anything changes and it becomes larger, more painful, you develop fever, nausea, vomiting you need to be seen immediately.  Follow-up with your primary care for recheck within a week.  Pudimos drenar esto en la clnica.  Me alegro que te sientas mejor.  Utilice Tylenol para Engineer, materials.  Comience con cefalexina 4 veces al da durante 5 [address]  Mantenga esta rea limpia con agua y [place] y aplique ungento Bactroban con los cambios de vendaje.  Contine con compresas tibias para estimular el drenaje.  Si algo cambia y se vuelve ms grande, ms doloroso, presenta fiebre, nuseas o vmitos, debe ser atendido de inmediato.  Haga un seguimiento con su atencin primaria para volver a controlarlo dentro de Teacher, English as a foreign language.

## 2023-12-02 NOTE — ED Triage Notes (Signed)
 Due to language barrier, an interpreter was present during the history-taking and subsequent discussion (and for part of the physical exam) with this patient. Patricia Lam. Number: [id number].  Here with family member. "I have a pimple or spot on my skin that is growing and causing pain". Patricia Lam "right buttocks". No fever known.

## 2023-12-15 DIAGNOSIS — O99210 Obesity complicating pregnancy, unspecified trimester: Secondary | ICD-10-CM | POA: Insufficient documentation

## 2023-12-15 DIAGNOSIS — Z98891 History of uterine scar from previous surgery: Secondary | ICD-10-CM | POA: Insufficient documentation

## 2023-12-25 ENCOUNTER — Ambulatory Visit: Payer: Self-pay

## 2023-12-28 ENCOUNTER — Other Ambulatory Visit: Payer: Self-pay

## 2023-12-28 ENCOUNTER — Ambulatory Visit: Payer: No Typology Code available for payment source | Admitting: Family Medicine

## 2023-12-28 VITALS — BP 123/76 | HR 86 | Wt 208.8 lb

## 2023-12-28 DIAGNOSIS — O26892 Other specified pregnancy related conditions, second trimester: Secondary | ICD-10-CM | POA: Diagnosis not present

## 2023-12-28 DIAGNOSIS — O30042 Twin pregnancy, dichorionic/diamniotic, second trimester: Secondary | ICD-10-CM

## 2023-12-28 DIAGNOSIS — Z98891 History of uterine scar from previous surgery: Secondary | ICD-10-CM | POA: Diagnosis not present

## 2023-12-28 DIAGNOSIS — Z3009 Encounter for other general counseling and advice on contraception: Secondary | ICD-10-CM

## 2023-12-28 DIAGNOSIS — O09522 Supervision of elderly multigravida, second trimester: Secondary | ICD-10-CM

## 2023-12-28 DIAGNOSIS — O30043 Twin pregnancy, dichorionic/diamniotic, third trimester: Secondary | ICD-10-CM

## 2023-12-28 DIAGNOSIS — O0990 Supervision of high risk pregnancy, unspecified, unspecified trimester: Secondary | ICD-10-CM

## 2023-12-28 DIAGNOSIS — Z3A17 17 weeks gestation of pregnancy: Secondary | ICD-10-CM

## 2023-12-28 DIAGNOSIS — O0992 Supervision of high risk pregnancy, unspecified, second trimester: Secondary | ICD-10-CM | POA: Diagnosis not present

## 2023-12-28 DIAGNOSIS — Z6791 Unspecified blood type, Rh negative: Secondary | ICD-10-CM

## 2023-12-28 NOTE — Progress Notes (Signed)
   PRENATAL VISIT NOTE  Subjective:  Patricia Lam is a 38 y.o. [phone] at [redacted]w[redacted]d being seen today for ongoing prenatal care.  She is currently monitored for the following issues for this high-risk pregnancy and has Supervision of high risk pregnancy, antepartum; AMA (advanced maternal age) multigravida 35+; Rh negative state in antepartum period; Dichorionic diamniotic twin gestation; Unwanted fertility; Obesity affecting pregnancy, antepartum; and History of 3 cesarean sections on their problem list.  Patient reports no complaints.  Contractions: Not present. Vag. Bleeding: None.  Movement: Present. Denies leaking of fluid.   The following portions of the patient's history were reviewed and updated as appropriate: allergies, current medications, past family history, past medical history, past social history, past surgical history and problem list.   Objective:   Vitals:   12/28/23 0944  BP: 123/76  Pulse: 86  Weight: 208 lb 12.8 oz (94.7 kg)    Fetal Status:     Movement: Present     General:  Alert, oriented and cooperative. Patient is in no acute distress.  Skin: Skin is warm and dry. No rash noted.   Cardiovascular: Normal heart rate noted  Respiratory: Normal respiratory effort, no problems with respiration noted  Abdomen: Soft, gravid, appropriate for gestational age.  Pain/Pressure: Present     Pelvic: Cervical exam deferred        Extremities: Normal range of motion.  Edema: None  Mental Status: Normal mood and affect. Normal behavior. Normal judgment and thought content.   Assessment and Plan:  Pregnancy: G4P3003 at [redacted]w[redacted]d 1. Supervision of high risk pregnancy, antepartum (Primary) AFP today - AFP, Serum, Open Spina Bifida  2. Dichorionic diamniotic twin pregnancy in third trimester Bedside u/s reveals two active babies with normal FHRs  3. History of 3 cesarean sections Will need repeat  4. Rh negative state in antepartum period Rhogam at 28 weeks and pp  if indicated  5. Unwanted fertility Will need BTL papers soon  6. Multigravida of advanced maternal age in second trimester LR NIPT  7. Language Barrier Spanish interpreter: Eda used  General obstetric precautions including but not limited to vaginal bleeding, contractions, leaking of fluid and fetal movement were reviewed in detail with the patient. Please refer to After Visit Summary for other counseling recommendations.   No follow-ups on file.  Future Appointments  Date Time Provider Department Center  01/16/2024  9:00 AM [facility] NURSE [facility]  01/16/2024  9:15 AM [facility] PROVIDER 1 [facility]  01/16/2024  9:30 AM [facility] US4 [facility]  01/25/2024 10:55 AM Adam Phenix, MD [facility]    Reva Bores, MD

## 2023-12-28 NOTE — Patient Instructions (Addendum)
 For cold and allergy symptoms  You may take  (Allegra, Claritin, Zyrtec, Xyzal, Clarinex-any one of these--they are the same class--same as Benadryl), saline or steroid (Nasacort, Nasonex and Flonase) nasal sprays.

## 2023-12-30 LAB — AFP, SERUM, OPEN SPINA BIFIDA
AFP MoM: 2.14
AFP Value: 67.7 ng/mL
Gest. Age on Collection Date: 17.2 wk
Maternal Age At EDD: 38.1 a
OSBR Risk 1 IN: 1289
Test Results:: NEGATIVE
Weight: 209 [lb_av]

## 2024-01-11 ENCOUNTER — Telehealth: Payer: Self-pay

## 2024-01-12 ENCOUNTER — Ambulatory Visit

## 2024-01-16 ENCOUNTER — Ambulatory Visit: Payer: Self-pay | Attending: Family Medicine

## 2024-01-16 ENCOUNTER — Other Ambulatory Visit: Payer: Self-pay | Admitting: *Deleted

## 2024-01-16 ENCOUNTER — Other Ambulatory Visit: Payer: Self-pay | Admitting: Family Medicine

## 2024-01-16 ENCOUNTER — Ambulatory Visit: Admitting: Obstetrics and Gynecology

## 2024-01-16 ENCOUNTER — Ambulatory Visit

## 2024-01-16 VITALS — BP 120/74 | HR 85

## 2024-01-16 DIAGNOSIS — E6689 Other obesity not elsewhere classified: Secondary | ICD-10-CM

## 2024-01-16 DIAGNOSIS — Z3A20 20 weeks gestation of pregnancy: Secondary | ICD-10-CM

## 2024-01-16 DIAGNOSIS — O34219 Maternal care for unspecified type scar from previous cesarean delivery: Secondary | ICD-10-CM

## 2024-01-16 DIAGNOSIS — O30043 Twin pregnancy, dichorionic/diamniotic, third trimester: Secondary | ICD-10-CM | POA: Diagnosis present

## 2024-01-16 DIAGNOSIS — O0990 Supervision of high risk pregnancy, unspecified, unspecified trimester: Secondary | ICD-10-CM | POA: Diagnosis present

## 2024-01-16 DIAGNOSIS — O35BXX1 Maternal care for other (suspected) fetal abnormality and damage, fetal cardiac anomalies, fetus 1: Secondary | ICD-10-CM

## 2024-01-16 DIAGNOSIS — O09522 Supervision of elderly multigravida, second trimester: Secondary | ICD-10-CM

## 2024-01-16 DIAGNOSIS — E669 Obesity, unspecified: Secondary | ICD-10-CM

## 2024-01-16 DIAGNOSIS — O30042 Twin pregnancy, dichorionic/diamniotic, second trimester: Secondary | ICD-10-CM | POA: Diagnosis not present

## 2024-01-16 DIAGNOSIS — O99212 Obesity complicating pregnancy, second trimester: Secondary | ICD-10-CM

## 2024-01-16 DIAGNOSIS — O35BXX0 Maternal care for other (suspected) fetal abnormality and damage, fetal cardiac anomalies, not applicable or unspecified: Secondary | ICD-10-CM | POA: Diagnosis not present

## 2024-01-16 DIAGNOSIS — Z362 Encounter for other antenatal screening follow-up: Secondary | ICD-10-CM

## 2024-01-16 NOTE — Progress Notes (Signed)
  Maternal-Fetal Medicine Consultation Name: Patricia Lam, Patricia Lam MRN: [id number]  G4 P3003 at 20w 1d gestation.  Patient is here for fetal anatomy scan.  Her high risk problems include: - Dichorionic--diamniotic twin pregnancy.  Pregnancy is dated by 8-week ultrasound. - Advanced maternal age.  On cell-free fetal DNA screening, the risks of fetal aneuploidies are not increased. -Previous 3 cesarean deliveries.  Obstetric history is significant for 3 term cesarean deliveries (2006/2011/2018)  Patient reports no chronic medical conditions including diabetes or hypertension.  Ultrasound Dichorionic-diamniotic twin pregnancy. Twin A: Maternal right, cephalic presentation, right lateral placenta, female fetus.  Amniotic fluid is normal good fetal activity seen.  Fetal biometry is consistent with the previously established dates.  An echogenic intracardiac focus is seen.  No other markers of aneuploidies or obvious fetal structural defects are seen.  Fetal anatomical survey was not completed because of fetal position.  Twin B: Maternal left, breech presentation, anterior placenta, female fetus.  Amniotic fluid is normal good fetal activity seen.  Fetal biometry is consistent with the previously established dates.  No markers of aneuploidies or obvious fetal structural defects are seen.  Fetal anatomical survey was not completed because of fetal position.  Growth discordancy: 8% (normal).  Because of previous cesarean section and the suspicion of thin myometrial bladder interface (we performed transvaginal ultrasound.  The cervix is long and closed and there is no evidence of placenta previa  Amniotic fluid debris was noted (patient does not give history of vaginal bleeding).  Bladder wall appeared thickened and maternal venous plexus are noted in the bladder wall.  The lower end of the right lateral placenta seemed close to the bladder but not encroaching on it.  Counseled the patient with the help  of Spanish language interpreter ([employer] services) Our concerns include: Dichorionic-diamniotic Twin pregnancy I discussed possible complications of twin pregnancies including preterm delivery, selective growth restriction, gestational diabetes and gestational hypertension/preeclampsia.  Patient takes low-dose aspirin prophylaxis.  I encouraged her to continue aspirin as it delays or prevents preeclampsia I discussed ultrasound protocol in twin pregnancies. Timing of delivery: In uncomplicated twin pregnancies, we recommend delivery at [redacted] weeks gestation.  Patient will be undergoing repeat cesarean delivery.  Echogenic intracardiac focus Given that she has low risk for fetal Down syndrome on cell-free fetal DNA screening, I reassured her that this finding should be considered a normal variant.  I counseled her that only amniocentesis will give a definitive result on the fetal karyotype.  Patient opted not have amniocentesis.  R/0 Placenta Accreta Spectrum History of cesarean deliveries increase the risk of placenta previa/placenta accreta spectrum.  Although there is no clear evidence of placenta accreta spectrum on today's ultrasound, the proximity of the placenta to the bladder wall (with prominent vessels), raises a small suspicion and warrants follow-up ultrasound.  I explained these findings to the patient. If placenta accreta spectrum is confirmed on follow-up ultrasound, we will refer her to [facility] for delivery.  Recommendations - An appointment was made for her to return in 4 weeks for completion of fetal anatomy and transvaginal ultrasound to rule out placenta accreta spectrum. - Fetal growth assessments every 4 weeks till delivery. - Delivery at [redacted] weeks gestation.  Consultation including face-to-face (more than 50%) counseling 50 minutes.

## 2024-01-17 ENCOUNTER — Encounter: Payer: Self-pay | Admitting: Family Medicine

## 2024-01-17 DIAGNOSIS — O43212 Placenta accreta, second trimester: Secondary | ICD-10-CM | POA: Insufficient documentation

## 2024-01-25 ENCOUNTER — Other Ambulatory Visit: Payer: Self-pay

## 2024-01-25 ENCOUNTER — Ambulatory Visit: Admitting: Obstetrics & Gynecology

## 2024-01-25 VITALS — BP 113/73 | HR 80 | Wt 215.0 lb

## 2024-01-25 DIAGNOSIS — O0992 Supervision of high risk pregnancy, unspecified, second trimester: Secondary | ICD-10-CM | POA: Diagnosis not present

## 2024-01-25 DIAGNOSIS — O09522 Supervision of elderly multigravida, second trimester: Secondary | ICD-10-CM

## 2024-01-25 DIAGNOSIS — Z98891 History of uterine scar from previous surgery: Secondary | ICD-10-CM | POA: Diagnosis not present

## 2024-01-25 DIAGNOSIS — O99212 Obesity complicating pregnancy, second trimester: Secondary | ICD-10-CM | POA: Diagnosis not present

## 2024-01-25 DIAGNOSIS — O99210 Obesity complicating pregnancy, unspecified trimester: Secondary | ICD-10-CM

## 2024-01-25 DIAGNOSIS — O30043 Twin pregnancy, dichorionic/diamniotic, third trimester: Secondary | ICD-10-CM

## 2024-01-25 DIAGNOSIS — Z3A21 21 weeks gestation of pregnancy: Secondary | ICD-10-CM

## 2024-01-25 DIAGNOSIS — O0990 Supervision of high risk pregnancy, unspecified, unspecified trimester: Secondary | ICD-10-CM

## 2024-01-25 DIAGNOSIS — O30042 Twin pregnancy, dichorionic/diamniotic, second trimester: Secondary | ICD-10-CM

## 2024-01-25 NOTE — Progress Notes (Signed)
   PRENATAL VISIT NOTE  Subjective:  Patricia Lam is a 38 y.o. [phone] at [redacted]w[redacted]d being seen today for ongoing prenatal care.  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  Patient reports no complaints.  Contractions: Not present. Vag. Bleeding: None.  Movement: Present. Denies leaking of fluid.   The following portions of the patient's history were reviewed and updated as appropriate: allergies, current medications, past family history, past medical history, past social history, past surgical history and problem list.   Objective:   Vitals:   01/25/24 1106  BP: 113/73  Pulse: 80  Weight: 215 lb (97.5 kg)    Fetal Status: Fetal Heart Rate (bpm): 150/155   Movement: Present     General:  Alert, oriented and cooperative. Patient is in no acute distress.  Skin: Skin is warm and dry. No rash noted.   Cardiovascular: Normal heart rate noted  Respiratory: Normal respiratory effort, no problems with respiration noted  Abdomen: Soft, gravid, appropriate for gestational age.  Pain/Pressure: Absent     Pelvic: Pelvic exam was not performed at this visit.         Extremities: Normal range of motion.  Edema: None  Mental Status: Normal mood and affect. Normal behavior. Normal judgment and thought content.   Assessment and Plan:  Pregnancy: G4P3003 at 105w3d 1. Supervision of high risk pregnancy, antepartum (Primary) - Given her Hx of three prior C-sections and twin pregnancy, counseled on increased risk of placenta accreta; discussed potential need for delivery at a higher-level center prepared for complications such as  postpartum hemorrhage and possible cesarean hysterectomy. - Counseled patient on upcoming 2-hour glucose tolerance test at next appointment to screen for gestational diabetes. - Continue routine prenatal care and follow up with MFM as indicated for ongoing assessment and management.  Preterm labor symptoms and general obstetric precautions including but not limited to vaginal bleeding, contractions, leaking of fluid and fetal movement were reviewed in detail with the patient. Please refer to After Visit Summary for other counseling recommendations.   No follow-ups on file.  Future Appointments  Date Time Provider Department Center  02/14/2024 10:00 AM [facility] PROVIDER 1 [facility]  02/14/2024 10:30 AM [facility] US3 [facility]    Kathryn Parish, Medical Student  Attestation of Attending Supervision of Medical Student: Evaluation and management procedures were performed by the medical student under my supervision and collaboration.  I have reviewed the student's note and chart, and I agree with the management and plan.  Onnie Bilis, MD, FACOG Attending Obstetrician & Gynecologist Faculty Practice, [facility]

## 2024-02-14 ENCOUNTER — Other Ambulatory Visit: Payer: Self-pay | Admitting: *Deleted

## 2024-02-14 ENCOUNTER — Ambulatory Visit (HOSPITAL_BASED_OUTPATIENT_CLINIC_OR_DEPARTMENT_OTHER): Admitting: Obstetrics and Gynecology

## 2024-02-14 ENCOUNTER — Ambulatory Visit: Attending: Obstetrics and Gynecology

## 2024-02-14 VITALS — BP 119/73 | HR 83

## 2024-02-14 DIAGNOSIS — Z3A24 24 weeks gestation of pregnancy: Secondary | ICD-10-CM | POA: Diagnosis present

## 2024-02-14 DIAGNOSIS — Z362 Encounter for other antenatal screening follow-up: Secondary | ICD-10-CM | POA: Diagnosis present

## 2024-02-14 DIAGNOSIS — O0990 Supervision of high risk pregnancy, unspecified, unspecified trimester: Secondary | ICD-10-CM

## 2024-02-14 DIAGNOSIS — O99212 Obesity complicating pregnancy, second trimester: Secondary | ICD-10-CM | POA: Diagnosis not present

## 2024-02-14 DIAGNOSIS — E669 Obesity, unspecified: Secondary | ICD-10-CM

## 2024-02-14 DIAGNOSIS — O09522 Supervision of elderly multigravida, second trimester: Secondary | ICD-10-CM

## 2024-02-14 DIAGNOSIS — O99210 Obesity complicating pregnancy, unspecified trimester: Secondary | ICD-10-CM

## 2024-02-14 DIAGNOSIS — O30042 Twin pregnancy, dichorionic/diamniotic, second trimester: Secondary | ICD-10-CM | POA: Insufficient documentation

## 2024-02-14 DIAGNOSIS — O35BXX0 Maternal care for other (suspected) fetal abnormality and damage, fetal cardiac anomalies, not applicable or unspecified: Secondary | ICD-10-CM

## 2024-02-14 DIAGNOSIS — O34219 Maternal care for unspecified type scar from previous cesarean delivery: Secondary | ICD-10-CM

## 2024-02-14 DIAGNOSIS — O43212 Placenta accreta, second trimester: Secondary | ICD-10-CM

## 2024-02-14 NOTE — Progress Notes (Signed)
 Maternal-Fetal Medicine Consultation Name: Patricia Lam MRN: [id number]  G4 P3003 at 24w 3d gestation.  Dichorionic-diamniotic twin pregnancy. Obstetric history significant for 3 term cesarean deliveries.  Ultrasound Twin A: Maternal right, cephalic presentation, anterior placenta, female fetus.  Amniotic fluid is normal and good fetal activity seen.  Fetal growth is appropriate for gestational age.  Twin B: Maternal left, breech presentation, anterior placenta, female fetus. Amniotic fluid is normal and good fetal activity seen.  Fetal growth is appropriate for gestational age.  Fetal anatomical survey in both twins still could not be completed because of fetal position.  No evidence of placenta previa or placenta accreta spectrum. Myometrial bladder interface appears thinned out with prominent venous plexus in the bladder.  I counseled the patient on the findings and reassured her that there is no evidence of placenta previa and placenta accreta spectrum. Ultrasound has limitations in diagnosing scar dehiscence.  Myometrial thinning has got a lower sensitivity in detecting uterine scar dehiscence. We will continue to monitor the lower uterine segment.  I discussed timing of delivery.  In uncomplicated dichorionic-diamniotic twin pregnancy, we recommend delivery at [redacted] weeks gestation.  Delivery at 41- or 37-weeks' gestation is reasonable if there ultrasound findings suggest the likelihood of potential scar rupture.   Recommendations - An appointment was made for her to return in 4 weeks for fetal growth assessment.    Consultation including face-to-face (more than 50%) counseling 20 minutes.

## 2024-02-22 ENCOUNTER — Encounter: Admitting: Obstetrics & Gynecology

## 2024-02-22 ENCOUNTER — Other Ambulatory Visit: Payer: Self-pay

## 2024-02-22 ENCOUNTER — Ambulatory Visit: Admitting: Obstetrics & Gynecology

## 2024-02-22 VITALS — BP 114/77 | HR 86 | Wt 218.3 lb

## 2024-02-22 DIAGNOSIS — Z3A25 25 weeks gestation of pregnancy: Secondary | ICD-10-CM

## 2024-02-22 DIAGNOSIS — O34219 Maternal care for unspecified type scar from previous cesarean delivery: Secondary | ICD-10-CM | POA: Diagnosis not present

## 2024-02-22 DIAGNOSIS — O30043 Twin pregnancy, dichorionic/diamniotic, third trimester: Secondary | ICD-10-CM | POA: Diagnosis not present

## 2024-02-22 DIAGNOSIS — Z98891 History of uterine scar from previous surgery: Secondary | ICD-10-CM

## 2024-02-22 DIAGNOSIS — O0990 Supervision of high risk pregnancy, unspecified, unspecified trimester: Secondary | ICD-10-CM

## 2024-02-22 NOTE — Progress Notes (Signed)
   PRENATAL VISIT NOTE  Subjective:  Patricia Lam is a 38 y.o. [phone] at [redacted]w[redacted]d being seen today for ongoing prenatal care.  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  Patient reports no complaints.  Contractions: Not present. Vag. Bleeding: None.  Movement: Present. Denies leaking of fluid.   The following portions of the patient's history were reviewed and updated as appropriate: allergies, current medications, past family history, past medical history, past social history, past surgical history and problem list.   Objective:    Vitals:   02/22/24 1135  BP: 114/77  Pulse: 86  Weight: 218 lb 4.8 oz (99 kg)    Fetal Status:  Fetal Heart Rate (bpm): 134/145   Movement: Present    General: Alert, oriented and cooperative. Patient is in no acute distress.  Skin: Skin is warm and dry. No rash noted.   Cardiovascular: Normal heart rate noted  Respiratory: Normal respiratory effort, no problems with respiration noted  Abdomen: Soft, gravid, appropriate for gestational age.  Pain/Pressure: Absent     Pelvic: Cervical exam deferred        Extremities: Normal range of motion.  Edema: None  Mental Status: Normal mood and affect. Normal behavior. Normal judgment and thought content.   Assessment and Plan:  Pregnancy: G4P3003 at [redacted]w[redacted]d 1. Supervision of high risk pregnancy, antepartum (Primary)   2. History of 3 cesarean sections   3. Dichorionic diamniotic twin pregnancy in third trimester Concordant growth  Preterm labor symptoms and general obstetric precautions including but not limited to vaginal  bleeding, contractions, leaking of fluid and fetal movement were reviewed in detail with the patient. Please refer to After Visit Summary for other counseling recommendations.   Return in about 3 weeks (around 03/14/2024).  Future Appointments  Date Time Provider Department Center  03/11/2024 10:00 AM [facility] PROVIDER 1 [facility]  03/11/2024 10:30 AM [facility] US7 [facility]  04/10/2024 10:00 AM [facility] PROVIDER 1 [facility]  04/10/2024 10:30 AM [facility] US3 [facility]    Onnie Bilis, MD

## 2024-03-11 ENCOUNTER — Ambulatory Visit: Attending: Obstetrics and Gynecology

## 2024-03-11 ENCOUNTER — Ambulatory Visit: Admitting: Obstetrics and Gynecology

## 2024-03-11 VITALS — BP 117/72 | HR 91

## 2024-03-11 DIAGNOSIS — O09522 Supervision of elderly multigravida, second trimester: Secondary | ICD-10-CM | POA: Insufficient documentation

## 2024-03-11 DIAGNOSIS — Z98891 History of uterine scar from previous surgery: Secondary | ICD-10-CM | POA: Insufficient documentation

## 2024-03-11 DIAGNOSIS — O43212 Placenta accreta, second trimester: Secondary | ICD-10-CM

## 2024-03-11 DIAGNOSIS — O99210 Obesity complicating pregnancy, unspecified trimester: Secondary | ICD-10-CM

## 2024-03-11 DIAGNOSIS — O34219 Maternal care for unspecified type scar from previous cesarean delivery: Secondary | ICD-10-CM | POA: Diagnosis not present

## 2024-03-11 DIAGNOSIS — O99212 Obesity complicating pregnancy, second trimester: Secondary | ICD-10-CM | POA: Diagnosis not present

## 2024-03-11 DIAGNOSIS — E669 Obesity, unspecified: Secondary | ICD-10-CM

## 2024-03-11 DIAGNOSIS — O30043 Twin pregnancy, dichorionic/diamniotic, third trimester: Secondary | ICD-10-CM

## 2024-03-11 DIAGNOSIS — O0990 Supervision of high risk pregnancy, unspecified, unspecified trimester: Secondary | ICD-10-CM

## 2024-03-11 DIAGNOSIS — Z3A28 28 weeks gestation of pregnancy: Secondary | ICD-10-CM | POA: Insufficient documentation

## 2024-03-11 DIAGNOSIS — O30042 Twin pregnancy, dichorionic/diamniotic, second trimester: Secondary | ICD-10-CM | POA: Diagnosis present

## 2024-03-11 NOTE — Progress Notes (Signed)
 Maternal-Fetal Medicine Consultation Name: Patricia Lam MRN: [id number]  Dichorionic-diamniotic twin pregnancy.  There was a suspicion of thinning of lower uterine segment myometrium. Obstetric history is significant for 3 term cesarean deliveries.  Ultrasound Twin A: Maternal right, cephalic presentation, anterior placenta.  Amniotic fluid is normal good fetal activity seen.  Fetal growth is appropriate for gestational age.  Twin B: Maternal left, transverse lie and head to maternal right, anterior placenta.  Amniotic fluid is normal good fetal activity seen.  Fetal growth is appropriate for gestational age. Growth discordancy: 9% (normal).  Myometrial bladder interface appears normal.  I counseled the patient that ultrasound has limitations in detecting uterine scar dehiscence. Given that we had the suspicion before, it is reasonable to deliver at [redacted] weeks gestation. I counseled the patient with the help of Spanish language interpreter present in the room.   Recommendations - An appointment was made for her to return in 4 weeks for fetal growth assessment. - I recommend delivery at [redacted] weeks gestation.   Consultation including face-to-face (more than 50%) counseling 10 minutes.

## 2024-03-14 ENCOUNTER — Other Ambulatory Visit: Payer: Self-pay

## 2024-03-14 DIAGNOSIS — O0990 Supervision of high risk pregnancy, unspecified, unspecified trimester: Secondary | ICD-10-CM

## 2024-03-15 ENCOUNTER — Ambulatory Visit: Payer: Self-pay | Admitting: Family Medicine

## 2024-03-15 ENCOUNTER — Other Ambulatory Visit: Payer: Self-pay

## 2024-03-15 ENCOUNTER — Other Ambulatory Visit

## 2024-03-15 ENCOUNTER — Encounter: Payer: Self-pay | Admitting: Family Medicine

## 2024-03-15 VITALS — BP 112/76 | HR 93 | Wt 223.7 lb

## 2024-03-15 DIAGNOSIS — O30043 Twin pregnancy, dichorionic/diamniotic, third trimester: Secondary | ICD-10-CM | POA: Diagnosis not present

## 2024-03-15 DIAGNOSIS — O43213 Placenta accreta, third trimester: Secondary | ICD-10-CM

## 2024-03-15 DIAGNOSIS — Z3A28 28 weeks gestation of pregnancy: Secondary | ICD-10-CM | POA: Diagnosis not present

## 2024-03-15 DIAGNOSIS — Z6791 Unspecified blood type, Rh negative: Secondary | ICD-10-CM

## 2024-03-15 DIAGNOSIS — Z23 Encounter for immunization: Secondary | ICD-10-CM | POA: Diagnosis not present

## 2024-03-15 DIAGNOSIS — O43212 Placenta accreta, second trimester: Secondary | ICD-10-CM

## 2024-03-15 DIAGNOSIS — O26893 Other specified pregnancy related conditions, third trimester: Secondary | ICD-10-CM | POA: Diagnosis not present

## 2024-03-15 DIAGNOSIS — O0993 Supervision of high risk pregnancy, unspecified, third trimester: Secondary | ICD-10-CM | POA: Diagnosis not present

## 2024-03-15 DIAGNOSIS — O99213 Obesity complicating pregnancy, third trimester: Secondary | ICD-10-CM

## 2024-03-15 DIAGNOSIS — Z98891 History of uterine scar from previous surgery: Secondary | ICD-10-CM

## 2024-03-15 DIAGNOSIS — O09523 Supervision of elderly multigravida, third trimester: Secondary | ICD-10-CM | POA: Diagnosis not present

## 2024-03-15 DIAGNOSIS — O0990 Supervision of high risk pregnancy, unspecified, unspecified trimester: Secondary | ICD-10-CM

## 2024-03-15 DIAGNOSIS — Z3009 Encounter for other general counseling and advice on contraception: Secondary | ICD-10-CM

## 2024-03-15 DIAGNOSIS — O99210 Obesity complicating pregnancy, unspecified trimester: Secondary | ICD-10-CM

## 2024-03-15 MED ORDER — RHO D IMMUNE GLOBULIN 1500 UNIT/2ML IJ SOSY
300.0000 ug | PREFILLED_SYRINGE | Freq: Once | INTRAMUSCULAR | Status: AC
  Administered 2024-03-15: 300 ug via INTRAMUSCULAR

## 2024-03-15 NOTE — Progress Notes (Signed)
   Subjective:  Patricia Lam is a 38 y.o. [phone] at [redacted]w[redacted]d being seen today for ongoing prenatal care.  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  Patient reports no complaints.  Contractions: Irritability. Vag. Bleeding: None.  Movement: Present. Denies leaking of fluid.   The following portions of the patient's history were reviewed and updated as appropriate: allergies, current medications, past family history, past medical history, past social history, past surgical history and problem list. Problem list updated.  Objective:   Vitals:   03/15/24 0959  BP: 112/76  Pulse: 93  Weight: 223 lb 11.2 oz (101.5 kg)    Fetal Status: Fetal Heart Rate (bpm): 144/148 (A144 B 148)   Movement: Present     General:  Alert, oriented and cooperative. Patient is in no acute distress.  Skin: Skin is warm and dry. No rash noted.   Cardiovascular: Normal heart rate noted  Respiratory: Normal respiratory effort, no problems with respiration noted  Abdomen: Soft, gravid, appropriate for gestational age. Pain/Pressure: Absent     Pelvic: Vag. Bleeding: None     Cervical exam deferred        Extremities: Normal range of motion.  Edema: None  Mental Status: Normal mood and affect. Normal behavior. Normal judgment and thought content.   Urinalysis:      Assessment and Plan:  Pregnancy: G4P3003 at [redacted]w[redacted]d  1. [redacted] weeks gestation of pregnancy (Primary)  - Tdap vaccine greater than or equal to 7yo IM  2. Rh negative state in antepartum period Rhogam given today - Antibody screen  3. Supervision of high risk pregnancy,  antepartum BP and FHRs both normal Third trimester labs today TDAP given  4. Dichorionic diamniotic twin pregnancy in third trimester F/w MFM Last growth US  03/11/24 Twin A 33%, AC 50%, AFI wnl Twin B 59%, AC 87%, AFI wnl 9% discordance  5. Multigravida of advanced maternal age in third trimester On ASA  6. History of 3 cesarean sections For RCS+BTL  7. Unwanted fertility BTL form signed today  8. Obesity affecting pregnancy, antepartum, unspecified obesity type   9. 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 Arnie Bibber with normal appearing bladder interface Regardless rec for delivery at 37 wks Message sent to schedule  Preterm labor symptoms and general obstetric precautions including but not limited to vaginal bleeding, contractions, leaking of fluid and fetal movement were reviewed in detail with the patient. Please refer to After Visit Summary for other counseling recommendations.  Return in 2 weeks (on 03/29/2024) for [facility], ob visit.   Teena Feast, MD

## 2024-03-15 NOTE — Addendum Note (Signed)
 Addended by: Ninette Basque on: 03/15/2024 10:54 AM   Modules accepted: Orders

## 2024-03-15 NOTE — Patient Instructions (Signed)
 Opciones de mtodos anticonceptivos Birth Control Options Los mtodos anticonceptivos tambin se denominan anticonceptivos. Los anticonceptivos previenen Firefighter. Hay muchos tipos de anticonceptivos. Trabaje con el mdico para encontrar la opcin ms adecuada para usted. Anticonceptivos que [place] hormonas Estos tipos de anticonceptivos contienen hormonas para Neurosurgeon. Implante anticonceptivo Este es un pequeo tubo que se coloca dentro de la piel del brazo. El tubo Insurance claims handler colocado durante 3 aos como mximo. Inyeccin anticonceptiva Son inyecciones que se aplican cada 3 meses. Pldoras anticonceptivas Esta es una pldora que se toma todos [place]. Debe tomarla a la [employer]. Parche anticonceptivo Este es un parche que se coloca sobre la piel. Se debe cambiar 1 vez por semana durante 3 semanas. Despus de [employer], el parche se debe retirar durante 1 semana. Anillo vaginal  Este es un anillo de plstico blando que se coloca dentro de la vagina. El anillo se deja colocado durante 3 semanas. Luego, se debe retirar durante 1 semana. Despus se coloca un nuevo anillo. Mtodos de barrera  Preservativo masculino Es una cubierta delgada que se coloca sobre el pene antes de [place]. El preservativo se desecha despus de Doctor, hospital. Preservativo femenino Es una cubierta blanda y suelta que se coloca en la vagina antes de [place]. El preservativo se desecha despus de Doctor, hospital. Diafragma El diafragma es una barrera blanda con forma de tazn. Debe estar hecho para adaptarse a su cuerpo. Se coloca en la vagina antes de tener sexo con una sustancia qumica que destruye los espermatozoides llamada espermicida. El Designer, fashion/clothing en la vagina durante 6 a 8 horas despus de tener sexo y debe retirarse en un plazo de 24 horas. El diafragma se debe reemplazar: Cada 1 o 2 aos. Despus de dar a luz. Despus de aumentar ms de 15 libras (6.8  kg). Si se somete a una ciruga en la pelvis. Capuchn cervical Este es un capuchn pequeo y [place] se fija sobre el cuello uterino. El cuello uterino es la parte ms baja del tero. Se coloca en la vagina antes del sexo, junto con un espermicida. El capuchn debe fabricarse para usted. El capuchn se debe dejar colocado durante 6 a 8 horas despus del sexo. Se debe retirar en un plazo de 48 horas. El capuchn cervical debe ser recetado y adaptado a su cuerpo por un mdico. Debe reemplazarse cada 2 aos. Esponja Esta es una esponja pequea que se coloca en la vagina antes de [place]. Se debe dejar colocada durante al menos 6 horas despus de [employer]. Se debe retirar en un plazo de 30 horas y desecharse. Espermicidas Son sustancias qumicas que destruyen o impiden que los espermatozoides ingresen al tero. Se pueden presentar en forma de pldora, crema, gel o espuma que se debe colocar en la vagina. Se deben usar al menos de 10 a 15 minutos antes de [employer]. Dispositivo intrauterino Un dispositivo intrauterino (DIU) es un dispositivo que un mdico coloca dentro del tero. Existen dos tipos: DIU hormonal. Este tipo puede permanecer colocado durante 3 a 5 aos. DIU de cobre. Este tipo Insurance claims handler colocado durante 10 aos. Mtodos anticonceptivos permanentes Ligadura de trompas en la mujer Es una ciruga para obstruir las trompas de [place]. Esterilizacin masculina Es una ciruga, llamada vasectoma, para ligar los conductos que transportan los espermatozoides en los hombres. Este mtodo funciona al cabo de 3 meses. Se deben usar otros mtodos anticonceptivos durante 3 meses. Mtodos de planificacin  natural Esto significa no tener [employer] la pareja femenina podra quedar embarazada. A continuacin se mencionan algunos mtodos anticonceptivos por planificacin natural: Usar un calendario a fin de: Hacer un seguimiento de la duracin de cada ciclo  menstrual. Determinar en [employer] se podra producir Firefighter. Planificar no tener [employer] en que se podra producir Firefighter. Reconocer los signos de la ovulacin y no tener relaciones sexuales durante ese perodo. La pareja femenina puede detectar cundo ser la ovulacin haciendo un seguimiento de su temperatura todos [place]. Tambin puede examinar si hay cambios en la mucosidad que proviene del cuello uterino. Dnde obtener ms informacin Centers for Disease Control and Prevention (Centros para el Control y la Prevencin de Enfermedades): [email or web address]. Luego: Introduzca "birth control" o "anticonceptivos" en el cuadro de bsqueda. Esta informacin no tiene Theme park manager el consejo del mdico. Asegrese de hacerle al mdico cualquier pregunta que tenga. Document Revised: 05/26/2023 Document Reviewed: 05/26/2023 Elsevier Patient Education  2024 [employer].

## 2024-03-16 LAB — CBC
Hematocrit: 34.7 % (ref 34.0–46.6)
Hemoglobin: 11.3 g/dL (ref 11.1–15.9)
MCH: 30.9 pg (ref 26.6–33.0)
MCHC: 32.6 g/dL (ref 31.5–35.7)
MCV: 95 fL (ref 79–97)
Platelets: 187 x10E3/uL (ref 150–450)
RBC: 3.66 x10E6/uL — ABNORMAL LOW (ref 3.77–5.28)
RDW: 13.3 % (ref 11.7–15.4)
WBC: 8.8 x10E3/uL (ref 3.4–10.8)

## 2024-03-16 LAB — RPR: RPR Ser Ql: NONREACTIVE

## 2024-03-16 LAB — HIV ANTIBODY (ROUTINE TESTING W REFLEX): HIV Screen 4th Generation wRfx: NONREACTIVE

## 2024-03-16 LAB — ANTIBODY SCREEN: Antibody Screen: NEGATIVE

## 2024-03-16 LAB — GLUCOSE TOLERANCE, 2 HOURS W/ 1HR
Glucose, 1 hour: 115 mg/dL (ref 70–179)
Glucose, 2 hour: 83 mg/dL (ref 70–152)
Glucose, Fasting: 75 mg/dL (ref 70–91)

## 2024-03-18 ENCOUNTER — Ambulatory Visit: Payer: Self-pay | Admitting: Family Medicine

## 2024-03-18 ENCOUNTER — Encounter: Payer: Self-pay | Admitting: Family Medicine

## 2024-03-18 DIAGNOSIS — O0990 Supervision of high risk pregnancy, unspecified, unspecified trimester: Secondary | ICD-10-CM

## 2024-03-29 ENCOUNTER — Encounter (HOSPITAL_COMMUNITY): Payer: Self-pay | Admitting: Obstetrics and Gynecology

## 2024-03-29 ENCOUNTER — Inpatient Hospital Stay (HOSPITAL_COMMUNITY)
Admission: AD | Admit: 2024-03-29 | Discharge: 2024-03-30 | Disposition: A | Discharge status: Home / Self Care | Attending: Obstetrics and Gynecology | Admitting: Obstetrics and Gynecology

## 2024-03-29 DIAGNOSIS — O30043 Twin pregnancy, dichorionic/diamniotic, third trimester: Secondary | ICD-10-CM | POA: Diagnosis not present

## 2024-03-29 DIAGNOSIS — Z3A30 30 weeks gestation of pregnancy: Secondary | ICD-10-CM | POA: Insufficient documentation

## 2024-03-29 DIAGNOSIS — O4693 Antepartum hemorrhage, unspecified, third trimester: Secondary | ICD-10-CM | POA: Insufficient documentation

## 2024-03-29 DIAGNOSIS — O30042 Twin pregnancy, dichorionic/diamniotic, second trimester: Secondary | ICD-10-CM | POA: Diagnosis not present

## 2024-03-29 DIAGNOSIS — N939 Abnormal uterine and vaginal bleeding, unspecified: Secondary | ICD-10-CM

## 2024-03-29 DIAGNOSIS — O09523 Supervision of elderly multigravida, third trimester: Secondary | ICD-10-CM | POA: Insufficient documentation

## 2024-03-29 DIAGNOSIS — O0990 Supervision of high risk pregnancy, unspecified, unspecified trimester: Secondary | ICD-10-CM

## 2024-03-29 DIAGNOSIS — O43212 Placenta accreta, second trimester: Secondary | ICD-10-CM

## 2024-03-29 LAB — URINALYSIS, ROUTINE W REFLEX MICROSCOPIC
Bilirubin Urine: NEGATIVE
Glucose, UA: NEGATIVE mg/dL
Hgb urine dipstick: NEGATIVE
Ketones, ur: NEGATIVE mg/dL
Leukocytes,Ua: NEGATIVE
Nitrite: NEGATIVE
Protein, ur: NEGATIVE mg/dL
Specific Gravity, Urine: 1.003 — ABNORMAL LOW (ref 1.005–1.030)
pH: 7 (ref 5.0–8.0)

## 2024-03-29 NOTE — MAU Note (Signed)
 With  interpreter- Earnie. Pt says at 9pm.-  had a drop of red blood on her underwear- none when she wiped. Last sex- Tuesday  [facility]- clinic. Denies UC's- 0/10 Feels babies moving

## 2024-03-30 ENCOUNTER — Telehealth: Payer: Self-pay | Admitting: Obstetrics and Gynecology

## 2024-03-30 ENCOUNTER — Other Ambulatory Visit (INDEPENDENT_AMBULATORY_CARE_PROVIDER_SITE_OTHER): Payer: Self-pay | Admitting: Advanced Practice Midwife

## 2024-03-30 DIAGNOSIS — O30042 Twin pregnancy, dichorionic/diamniotic, second trimester: Secondary | ICD-10-CM

## 2024-03-30 DIAGNOSIS — N760 Acute vaginitis: Secondary | ICD-10-CM | POA: Diagnosis not present

## 2024-03-30 DIAGNOSIS — B9689 Other specified bacterial agents as the cause of diseases classified elsewhere: Secondary | ICD-10-CM | POA: Diagnosis not present

## 2024-03-30 DIAGNOSIS — Z3A30 30 weeks gestation of pregnancy: Secondary | ICD-10-CM

## 2024-03-30 DIAGNOSIS — N939 Abnormal uterine and vaginal bleeding, unspecified: Secondary | ICD-10-CM

## 2024-03-30 LAB — WET PREP, GENITAL
Sperm: NONE SEEN
Trich, Wet Prep: NONE SEEN
WBC, Wet Prep HPF POC: >=10 — AB (ref <10)
Yeast Wet Prep HPF POC: NONE SEEN

## 2024-03-30 MED ORDER — METRONIDAZOLE 500 MG PO TABS: 500.0000 mg | ORAL_TABLET | Freq: Two times a day (BID) | ORAL | 0 refills | Status: AC

## 2024-03-30 NOTE — Telephone Encounter (Signed)
 Spanish interpretor used to call the patient.  +BV on wet prep  Rx : Flagyl  Dorita Delon FERNS, NP 03/30/2024 12:41 PM

## 2024-03-30 NOTE — MAU Provider Note (Signed)
 Chief Complaint:  Vaginal Bleeding   Event Date/Time   First Provider Initiated Contact with Patient 03/30/24 0010      HPI: Patricia Lam is a 38 y.o. [id number] at [redacted]w[redacted]d with di/di twins who presents to maternity admissions reporting she saw a small spot in her underwear. There has been no additional bleeding. There is no pain.  Last intercourse was 3 days ago. She reports good fetal movement x 2.  HPI  Past Medical History: Past Medical History:  Diagnosis Date   Medical history non-contributory    Status post repeat low transverse cesarean section 07/18/2017    Past obstetric history: OB History  Gravida Para Term Preterm AB Living  4 3 3  0 0 3  SAB IAB Ectopic Multiple Live Births  0 0 0 0 3    # Outcome Date GA Lbr Len/2nd Weight Sex Type Anes PTL Lv  4 Current           3 Term 07/18/17 [redacted]w[redacted]d  3221 g F CS-LTranv Spinal  LIV     Birth Comments: wnl  2 Term 2011    M CS-LTranv   LIV  1 Term 06/15/05    JULIANNA Lam       Past Surgical History: Past Surgical History:  Procedure Laterality Date   CESAREAN SECTION     CESAREAN SECTION N/A 07/18/2017   Procedure: CESAREAN SECTION;  Surgeon: Barbra Lang PARAS, DO;  [facility]: [facility];  Service: Obstetrics;  Laterality: N/A;    Family History: Family History  Problem Relation Age of Onset   Hypertension Father    Hypertension Brother    Obesity Brother    Cancer Maternal Aunt    Asthma Neg Hx    Diabetes Neg Hx    Heart disease Neg Hx     Social History: Social History   Tobacco Use   Smoking status: Never   Smokeless tobacco: Never  Vaping Use   Vaping status: Never Used  Substance Use Topics   Alcohol use: No   Drug use: No    Allergies: No Known Allergies  Meds:  Medications Prior to Admission  Medication Sig Dispense Refill Last Dose/Taking   aspirin  EC 81 MG tablet Take 1 tablet (81 mg total) by mouth daily. Swallow whole. 30 tablet 12 03/28/2024   Prenatal Vit-Fe Fumarate-FA  (PRENATAL VITAMIN PO) Take 1 tablet by mouth daily.    03/28/2024    ROS:  Review of Systems  Constitutional:  Negative for chills, fatigue and fever.  Eyes:  Negative for visual disturbance.  Respiratory:  Negative for shortness of breath.   Cardiovascular:  Negative for chest pain.  Gastrointestinal:  Negative for abdominal pain, nausea and vomiting.  Genitourinary:  Positive for vaginal bleeding. Negative for difficulty urinating, dysuria, flank pain, pelvic pain, vaginal discharge and vaginal pain.  Neurological:  Negative for dizziness and headaches.  Psychiatric/Behavioral: Negative.       I have reviewed patient's Past Medical Hx, Surgical Hx, Family Hx, Social Hx, medications and allergies.   Physical Exam  Patient Vitals for the past 24 hrs:  BP Temp Temp src Pulse Resp Height Weight  03/29/24 2311 130/71 -- -- 82 -- -- --  03/29/24 2228 123/80 98.4 F (36.9 C) Oral 77 12 5' 5 (1.651 m) 104.1 kg  03/29/24 2224 -- -- -- -- -- 5' 5 (1.651 m) 104.1 kg   Constitutional: Well-developed, well-nourished female in no acute distress.  Cardiovascular: normal rate Respiratory: normal effort GI:  Abd soft, non-tender, gravid appropriate for gestational age.  MS: Extremities nontender, no edema, normal ROM Neurologic: Alert and oriented x 4.  GU: Neg CVAT.  PELVIC EXAM: Cervix pink, visually closed, without lesion,moderate amount frothy white discharge, vaginal walls and external genitalia normal  Dilation: Closed Exam by:: Olam Silva Fuelling, CNM  FHT:  Baseline 135 , moderate variability, accelerations present, no decelerations Baseline 130 , moderate variability, accelerations present, no decelerations Contractions: none on toco or to palpation   Labs: Results for orders placed or performed during the hospital encounter of 03/29/24 (from the past 24 hours)  Urinalysis, Routine w reflex microscopic -Urine, Clean Catch     Status: Abnormal   Collection Time: 03/29/24  10:31 PM  Result Value Ref Range   Color, Urine STRAW (A) YELLOW   APPearance CLEAR CLEAR   Specific Gravity, Urine 1.003 (L) 1.005 - 1.030   pH 7.0 5.0 - 8.0   Glucose, UA NEGATIVE NEGATIVE mg/dL   Hgb urine dipstick NEGATIVE NEGATIVE   Bilirubin Urine NEGATIVE NEGATIVE   Ketones, ur NEGATIVE NEGATIVE mg/dL   Protein, ur NEGATIVE NEGATIVE mg/dL   Nitrite NEGATIVE NEGATIVE   Leukocytes,Ua NEGATIVE NEGATIVE   O/Negative/-- (01/22 1026)  Imaging:   MAU Course/MDM: Orders Placed This Encounter  Procedures   Wet prep, genital   Urinalysis, Routine w reflex microscopic -Urine, Clean Catch   Lab instructions   Discharge patient Discharge disposition: 01-Home or Self Care; Discharge patient date: 03/30/2024    No orders of the defined types were placed in this encounter.    NST reviewed and reactive x 2 No bleeding on SSE Discharge c/w BV, wet prep with clue cells Called patient to notify her of BV dx and Rx Flagyl 500 mg BID x 7 days Bleeding precautions given     Assessment: 1. Supervision of high risk pregnancy, antepartum   2. Multigravida of advanced maternal age in third trimester   3. Placenta accreta in second trimester   4. Vaginal spotting   5. [redacted] weeks gestation of pregnancy   6. Dichorionic diamniotic twin pregnancy in second trimester     Plan: Discharge home Labor precautions and fetal kick counts  Follow-up Information     [facility] Follow up.   Specialty: Obstetrics and Gynecology Why: As needed for emergencies Contact information: [address] [place]  [phone]               Allergies as of 03/30/2024   No Known Allergies      Medication List     TAKE these medications    aspirin  EC 81 MG tablet Take 1 tablet (81 mg total) by mouth daily. Swallow whole.   PRENATAL VITAMIN PO Take 1 tablet by mouth daily.        Olam Boards Certified  Nurse-Midwife 03/30/2024 1:18 AM

## 2024-04-01 ENCOUNTER — Other Ambulatory Visit: Payer: Self-pay

## 2024-04-01 ENCOUNTER — Ambulatory Visit: Admitting: Advanced Practice Midwife

## 2024-04-01 VITALS — BP 128/86 | HR 80 | Wt 226.0 lb

## 2024-04-01 DIAGNOSIS — O09523 Supervision of elderly multigravida, third trimester: Secondary | ICD-10-CM

## 2024-04-01 DIAGNOSIS — Z3A31 31 weeks gestation of pregnancy: Secondary | ICD-10-CM

## 2024-04-01 DIAGNOSIS — O26899 Other specified pregnancy related conditions, unspecified trimester: Secondary | ICD-10-CM

## 2024-04-01 DIAGNOSIS — O34219 Maternal care for unspecified type scar from previous cesarean delivery: Secondary | ICD-10-CM | POA: Diagnosis not present

## 2024-04-01 DIAGNOSIS — O99210 Obesity complicating pregnancy, unspecified trimester: Secondary | ICD-10-CM

## 2024-04-01 DIAGNOSIS — O0990 Supervision of high risk pregnancy, unspecified, unspecified trimester: Secondary | ICD-10-CM

## 2024-04-01 DIAGNOSIS — Z6791 Unspecified blood type, Rh negative: Secondary | ICD-10-CM

## 2024-04-01 DIAGNOSIS — Z3009 Encounter for other general counseling and advice on contraception: Secondary | ICD-10-CM

## 2024-04-01 DIAGNOSIS — O360130 Maternal care for anti-D [Rh] antibodies, third trimester, not applicable or unspecified: Secondary | ICD-10-CM

## 2024-04-01 DIAGNOSIS — O26893 Other specified pregnancy related conditions, third trimester: Secondary | ICD-10-CM

## 2024-04-01 DIAGNOSIS — O30043 Twin pregnancy, dichorionic/diamniotic, third trimester: Secondary | ICD-10-CM | POA: Diagnosis not present

## 2024-04-01 DIAGNOSIS — Z98891 History of uterine scar from previous surgery: Secondary | ICD-10-CM

## 2024-04-01 NOTE — Progress Notes (Unsigned)
   PRENATAL VISIT NOTE  Subjective:  Patricia Lam is a 38 y.o. [phone] at [redacted]w[redacted]d being seen today for ongoing prenatal care.  She is currently monitored for the following issues for this high-risk pregnancy and has Supervision of high risk pregnancy, antepartum; AMA (advanced maternal age) multigravida 35+; Rh negative state in antepartum period; Dichorionic diamniotic twin gestation; Unwanted fertility; Obesity affecting pregnancy, antepartum; History of 3 cesarean sections; Placenta accreta in second trimester; and Bacterial vaginosis on their problem list.   Patient reports no complaints.  Contractions: Not present. Vag. Bleeding: None.  Movement: Present. Denies leaking of fluid.   The following portions of the patient's history were reviewed and updated as appropriate: allergies, current medications, past family history, past medical history, past social history, past surgical history and problem list.   Objective:   Vitals:   04/01/24 1514  BP: 128/86  Pulse: 80  Weight: 226 lb (102.5 kg)    Fetal Status: Fetal Heart Rate (bpm): 132/155   Movement: Present     General:  Alert, oriented and cooperative. Patient is in no acute distress.  Skin: Skin is warm and dry. No rash noted.   Cardiovascular: Normal heart rate noted  Respiratory: Normal respiratory effort, no problems with respiration noted  Abdomen: Soft, gravid, appropriate for gestational age.  Pain/Pressure: Absent     Pelvic: Cervical exam deferred        Extremities: Normal range of motion.  Edema: None  Mental Status: Normal mood and affect. Normal behavior. Normal judgment and thought content.   Assessment and Plan:  Pregnancy: G4P3003 at [redacted]w[redacted]d 1. [redacted] weeks gestation of pregnancy (Primary)  2. Supervision of high risk pregnancy, antepartum  3. Unwanted fertility  4. Rh negative state in antepartum period  Preterm labor symptoms and general obstetric precautions including but not limited to vaginal  bleeding, contractions, leaking of fluid and fetal movement were reviewed in detail with the patient. Please refer to After Visit Summary for other counseling recommendations.   No follow-ups on file.  Future Appointments  Date Time Provider Department Center  04/10/2024 10:00 AM [facility] PROVIDER 1 [facility]  04/10/2024 10:30 AM [facility] US3 [facility]    Quiara Killian  Claudene HOWARD [facility] for [employer]

## 2024-04-02 LAB — GC/CHLAMYDIA PROBE AMP (~~LOC~~) NOT AT ARMC
Chlamydia: NEGATIVE
Comment: NEGATIVE
Comment: NORMAL
Neisseria Gonorrhea: NEGATIVE

## 2024-04-10 ENCOUNTER — Ambulatory Visit: Attending: Obstetrics and Gynecology

## 2024-04-10 ENCOUNTER — Other Ambulatory Visit: Payer: Self-pay | Admitting: *Deleted

## 2024-04-10 ENCOUNTER — Ambulatory Visit: Admitting: Obstetrics and Gynecology

## 2024-04-10 VITALS — BP 121/73 | HR 91

## 2024-04-10 DIAGNOSIS — E669 Obesity, unspecified: Secondary | ICD-10-CM

## 2024-04-10 DIAGNOSIS — O09522 Supervision of elderly multigravida, second trimester: Secondary | ICD-10-CM | POA: Diagnosis present

## 2024-04-10 DIAGNOSIS — O99213 Obesity complicating pregnancy, third trimester: Secondary | ICD-10-CM

## 2024-04-10 DIAGNOSIS — O0990 Supervision of high risk pregnancy, unspecified, unspecified trimester: Secondary | ICD-10-CM | POA: Diagnosis present

## 2024-04-10 DIAGNOSIS — Z3A32 32 weeks gestation of pregnancy: Secondary | ICD-10-CM

## 2024-04-10 DIAGNOSIS — O0993 Supervision of high risk pregnancy, unspecified, third trimester: Secondary | ICD-10-CM | POA: Diagnosis not present

## 2024-04-10 DIAGNOSIS — O30043 Twin pregnancy, dichorionic/diamniotic, third trimester: Secondary | ICD-10-CM | POA: Diagnosis present

## 2024-04-10 DIAGNOSIS — O99212 Obesity complicating pregnancy, second trimester: Secondary | ICD-10-CM | POA: Diagnosis present

## 2024-04-10 DIAGNOSIS — O09523 Supervision of elderly multigravida, third trimester: Secondary | ICD-10-CM | POA: Insufficient documentation

## 2024-04-10 DIAGNOSIS — O30042 Twin pregnancy, dichorionic/diamniotic, second trimester: Secondary | ICD-10-CM | POA: Insufficient documentation

## 2024-04-10 DIAGNOSIS — O34219 Maternal care for unspecified type scar from previous cesarean delivery: Secondary | ICD-10-CM | POA: Diagnosis present

## 2024-04-10 DIAGNOSIS — O43212 Placenta accreta, second trimester: Secondary | ICD-10-CM

## 2024-04-10 DIAGNOSIS — O358XX1 Maternal care for other (suspected) fetal abnormality and damage, fetus 1: Secondary | ICD-10-CM

## 2024-04-10 NOTE — Progress Notes (Signed)
 Maternal-Fetal Medicine Consultation Name: Patricia Lam MRN: [id number]  G4 P3003 at 32w 2d gestation. Dichorionic-diamniotic twin pregnancy.  Patient returned for fetal growth assessment. Obstetric history significant for 3 term vaginal deliveries.  Patient does not have gestational diabetes. Blood pressure today at our office is 121/73 mmHg.  Ultrasound Twin A: Maternal right, cephalic presentation, anterior placenta.  Amniotic fluid is normal good fetal activity seen.  Fetal growth is appropriate for gestational age.  The estimated fetal weight is at the 10th percentile and the abdominal circumference measurement at the 13th percentile.  Twin B: Maternal left, cephalic presentation, anterior placenta.  Amniotic fluid is normal good fetal activity seen.  Fetal growth is appropriate for gestational age.  Growth discordancy: 15% (normal). We could not assess the lower segment clearly on today's ultrasound.  I reassured the patient of the findings.  I counseled the couple with the help of Spanish language interpreter present in the room.  I had recommended cesarean delivery at [redacted] weeks gestation because of suspicion of thinning of lower uterine segment.  Patient does not have any abdominal pain. She is scheduled to undergo cesarean delivery on 05/13/2024.  Recommendations - An appointment was made for her to return in 4 weeks for fetal growth assessment and BPP.  Consultation including face-to-face (more than 50%) counseling 10 minutes.

## 2024-04-19 ENCOUNTER — Ambulatory Visit: Admitting: Family Medicine

## 2024-04-19 ENCOUNTER — Encounter: Payer: Self-pay | Admitting: Family Medicine

## 2024-04-19 ENCOUNTER — Other Ambulatory Visit: Payer: Self-pay

## 2024-04-19 VITALS — BP 117/78 | HR 87 | Wt 227.1 lb

## 2024-04-19 DIAGNOSIS — Z3009 Encounter for other general counseling and advice on contraception: Secondary | ICD-10-CM | POA: Diagnosis not present

## 2024-04-19 DIAGNOSIS — O0993 Supervision of high risk pregnancy, unspecified, third trimester: Secondary | ICD-10-CM | POA: Diagnosis not present

## 2024-04-19 DIAGNOSIS — O43213 Placenta accreta, third trimester: Secondary | ICD-10-CM

## 2024-04-19 DIAGNOSIS — O30043 Twin pregnancy, dichorionic/diamniotic, third trimester: Secondary | ICD-10-CM

## 2024-04-19 DIAGNOSIS — Z3A33 33 weeks gestation of pregnancy: Secondary | ICD-10-CM

## 2024-04-19 DIAGNOSIS — O09523 Supervision of elderly multigravida, third trimester: Secondary | ICD-10-CM

## 2024-04-19 DIAGNOSIS — Z6791 Unspecified blood type, Rh negative: Secondary | ICD-10-CM | POA: Diagnosis not present

## 2024-04-19 DIAGNOSIS — O26899 Other specified pregnancy related conditions, unspecified trimester: Secondary | ICD-10-CM

## 2024-04-19 DIAGNOSIS — O0990 Supervision of high risk pregnancy, unspecified, unspecified trimester: Secondary | ICD-10-CM

## 2024-04-19 DIAGNOSIS — O99619 Diseases of the digestive system complicating pregnancy, unspecified trimester: Secondary | ICD-10-CM

## 2024-04-19 DIAGNOSIS — K219 Gastro-esophageal reflux disease without esophagitis: Secondary | ICD-10-CM

## 2024-04-19 DIAGNOSIS — O43212 Placenta accreta, second trimester: Secondary | ICD-10-CM

## 2024-04-19 DIAGNOSIS — O99210 Obesity complicating pregnancy, unspecified trimester: Secondary | ICD-10-CM

## 2024-04-19 DIAGNOSIS — O26893 Other specified pregnancy related conditions, third trimester: Secondary | ICD-10-CM | POA: Diagnosis not present

## 2024-04-19 DIAGNOSIS — Z98891 History of uterine scar from previous surgery: Secondary | ICD-10-CM

## 2024-04-19 DIAGNOSIS — O99613 Diseases of the digestive system complicating pregnancy, third trimester: Secondary | ICD-10-CM

## 2024-04-19 DIAGNOSIS — O99213 Obesity complicating pregnancy, third trimester: Secondary | ICD-10-CM

## 2024-04-19 MED ORDER — PANTOPRAZOLE SODIUM 40 MG PO TBEC: 40.0000 mg | DELAYED_RELEASE_TABLET | Freq: Two times a day (BID) | ORAL | 1 refills | Status: DC

## 2024-04-19 NOTE — Patient Instructions (Signed)
 Tercer trimestre de Psychiatrist Third Trimester of Pregnancy  El tercer trimestre de embarazo va desde la semana 28 hasta la semana 40. Esto corresponde a los meses 7 a 9. El tercer trimestre es un perodo en el que el beb crece rpido. Cambios en el cuerpo durante el tercer trimestre Su cuerpo contina cambiando durante este perodo. En general, los cambios desaparecen despus del nacimiento del beb. Cambios fsicos Usted seguir aumentando de [place]. Podrn aparecer [employer]on's caderas, la barriga y las [place]. Las [employer] seguirn creciendo y pueden dolerle. Un lquido amarillo Charity fundraiser) puede salir de sus pechos. Esta es la primera leche que usted produce para el beb. El cabello puede crecerle ms rpido y engrosarse. En algunos casos, puede haber cada del cabello. El ombligo puede salir hacia afuera. Puede observar que se le hinchan ms las [address], la cara o los tobillos. Cambios en la salud Es posible que tenga acidez estomacal. Es posible que sienta que le falta el aire. La causa de esto es que el tero ahora es ms grande. Puede presentar ms dolor en la pelvis, la espalda o los muslos. Puede presentar ms hormigueo o entumecimiento en las manos, los brazos y las piernas. Es posible que orine con mayor frecuencia. Puede tener dificultad para defecar (estreimiento) o venas hinchadas en el ano que pueden picar o doler (hemorroides). Otros cambios Puede tener ms problemas para dormir. Puede notar que el beb se mueve ms hacia bajo adentro de la barriga [place]"). Puede ser que le salga ms lquido de la vagina. Puede sentir las articulaciones flojas y puede sentir dolor alrededor del hueso plvico. Siga estas instrucciones en su casa: Medicamentos Use los medicamentos solamente como se lo haya indicado el mdico. Algunos medicamentos no son seguros Academic librarian. El mdico puede cambiar los medicamentos que [place]. No use ningn medicamento a menos que se lo haya indicado el  mdico. Tome vitaminas prenatales que tengan por lo menos 600 microgramos (mcg) de cido flico. No consuma medicamentos a base de hierbas, drogas ilegales, ni medicamentos que el mdico no haya autorizado. Comida y bebida Durante el [place], [place] cuerpo necesita nutricin adicional para brindar sustento al beb que est creciendo. Hable con su mdico sobre sus necesidades nutricionales. Actividad La mayora de las mujeres pueden hacer ejercicio regularmente durante el [place]. Las rutinas de ejercicio pueden tener que cambiar en la etapa final del [place]. Hable con su mdico sobre sus actividades y [place] de ejercicio. Alivio del dolor y del malestar Descanse a menudo y con las piernas elevadas si tiene Educational psychologist en las piernas o dolor de Patent attorney. Tome baos de asiento tibios para Engineer, materials de las hemorroides. Use una crema para las hemorroides si el mdico se lo permite. Use un sostn que le brinde buen soporte si le [employer]. No se d baos de inmersin en agua caliente, baos turcos ni saunas. No se haga duchas vaginales. No use tampones ni protectores de ropa interior perfumados. Seguridad Hable con su mdico antes de viajar distancias largas. Use el cinturn de seguridad en todo momento mientras vaya en auto. Hable con su mdico si alguien la golpea, la lastima o le grita. Preparacin para el nacimiento Preparacin para recibir al beb: Tome clases para prepararse para el parto y Patent examiner. Visite el hospital y recorra el rea de maternidad. Compre un asiento de seguridad [employer] atrs para llevar al beb en el automvil. Aprenda cmo instalarlo en el auto. Instrucciones generales Evite el contacto con  las bandejas sanitarias de los gatos y la tierra que estos animales usan. Estos objetos tienen grmenes que puedenperjudicar el embarazo y el beb. No beba alcohol, no fume, no vapee ni consuma productos que tengan nicotina o tabaco. Si necesita ayuda para  dejar de fumar, hable con su mdico. Asista a todas las visitas de seguimiento del tercer trimestre. El mdico le har ms exmenes y [employer] trimestre. Escriba sus preguntas. Llvelas cuando concurra a las visitas prenatales. El mdico tambin: Hablar con usted sobre su salud general. Conley Rolls dar consejos o la derivar a especialistas que puedan ayudarla con diferentes necesidades, entre ellas: Salud mental y psicoterapia. Alimentos y alimentacin saludable. Si necesita ayuda con alimentos, pdala. Dnde buscar ms informacin American Pregnancy Association (Asociacin Americana del Embarazo): americanpregnancy.org [employer] of Obstetricians and Gynecologists (Colegio Estadounidense de Obstetras y Scientific laboratory technician): acog.org Office on [employer] (Oficina para la Salud de la Mujer): TravelLesson.ca Comunquese con un mdico si: Tiene un dolor de cabeza que no desaparece despus de Science writer. Tiene alguno de estos problemas: No puede comer ni beber. Tiene nuseas y vmitos. Hace deposiciones acuosas (diarrea) durante 2 o ms das. Siente dolor al orinar o hace orina con mal olor. Se ha sentido enferma durante 2 o ms das y no mejora. Comunquese con su mdico de inmediato si: Alguna de estas sustancias emana de la vagina: Secrecin anormal. Lquido con mal olor. Sangrado. El beb se mueve menos de lo habitual. Presenta signos de [place] de parto: Tiene contracciones, clicos en la barriga o dolor en la pelvis o la parte baja de la espalda antes de las 37 semanas de embarazo (trabajo de parto prematuro). Tiene contracciones regulares separadas por menos de 5 minutos. Rompe la bolsa. Tiene sntomas de presin arterial alta o preeclampsia. Esto incluye lo siguiente: Dolor de [place] intenso y punzante que no desaparece. Hinchazn sbita o extrema del rostro, las manos, las piernas o los pies. Problemas de visin: Ve manchas. Tiene la visin borrosa. Los ojos  tienen sensibilidad a Statistician. Si no puede comunicarse con su mdico, acuda a una sala de atencin de urgencias o emergencias. Solicite ayuda de inmediato si: Se desmaya, se siente confundida o no puede pensar con claridad. Tiene dolor en el pecho o dificultad para respirar. Sufre cualquier tipo de lesin, por ejemplo, debido a una cada o un accidente automovilstico. Estos sntomas pueden indicar una emergencia. Llame al 911 de inmediato. No espere a ver si los sntomas desaparecen. No conduzca por sus propios medios [employer]. Esta informacin no tiene Theme park manager el consejo del mdico. Asegrese de hacerle al mdico cualquier pregunta que tenga. Document Revised: 02/23/2023 Document Reviewed: 02/23/2023 Elsevier Patient Education  2024 [employer].

## 2024-04-19 NOTE — Progress Notes (Signed)
   Subjective:  Patricia Lam is a 38 y.o. [phone] at [redacted]w[redacted]d being seen today for ongoing prenatal care.  She is currently monitored for the following issues for this high-risk pregnancy and has Supervision of high risk pregnancy, antepartum; AMA (advanced maternal age) multigravida 35+; Rh negative state in antepartum period; Dichorionic diamniotic twin gestation; Unwanted fertility; Obesity affecting pregnancy, antepartum; History of 3 cesarean sections; Placenta accreta in second trimester; and Bacterial vaginosis on their problem list.  Patient reports no complaints.  Contractions: Irritability. Vag. Bleeding: None.  Movement: Present. Denies leaking of fluid.   The following portions of the patient's history were reviewed and updated as appropriate: allergies, current medications, past family history, past medical history, past social history, past surgical history and problem list. Problem list updated.  Objective:   Vitals:   04/19/24 1022  BP: 117/78  Pulse: 87  Weight: 227 lb 1.6 oz (103 kg)    Fetal Status: Fetal Heart Rate (bpm): 134/164   Movement: Present     General:  Alert, oriented and cooperative. Patient is in no acute distress.  Skin: Skin is warm and dry. No rash noted.   Cardiovascular: Normal heart rate noted  Respiratory: Normal respiratory effort, no problems with respiration noted  Abdomen: Soft, gravid, appropriate for gestational age. Pain/Pressure: Present     Pelvic: Vag. Bleeding: None     Cervical exam deferred        Extremities: Normal range of motion.  Edema: Trace  Mental Status: Normal mood and affect. Normal behavior. Normal judgment and thought content.   Urinalysis:      Assessment and Plan:  Pregnancy: G4P3003 at [redacted]w[redacted]d  1. Supervision of high risk pregnancy, antepartum (Primary) BP and both FHR's normal Lots of reflux, trial PPI  2. Unwanted fertility Consent signed 03/18/2024  3. Rh negative state in antepartum period S/p  rhogam on 03/15/2024  4. Placenta accreta in second trimester Suspicious thinning of LUS on US , delivery OK for [facility] but recommended at 37 weeks Scheduled with Dr. Barbra and Dr. Cleatus for 05/13/2024  5. Obesity affecting pregnancy, antepartum, unspecified obesity type F/w MFM  6. History of 3 cesarean sections Scheduled for RCS+BTL at 37 weeks  7. Dichorionic diamniotic twin pregnancy in third trimester Last growth US  04/10/2024 Twin A EFW 10%, 1683g, AC 13%, AFI wnl, cephalic presentation Twin B EFW 46%, 1989g, AC 83%, AFI wnl, cephalic presentation Discordance 15% Weekly testing at 36 weeks per MFM guidelines  8. Multigravida of advanced maternal age in third trimester   Preterm labor symptoms and general obstetric precautions including but not limited to vaginal bleeding, contractions, leaking of fluid and fetal movement were reviewed in detail with the patient. Please refer to After Visit Summary for other counseling recommendations.  Return in 2 weeks (on 05/03/2024) for [facility], ob visit, needs MD.   Lola Donnice HERO, MD

## 2024-04-29 NOTE — Patient Instructions (Signed)
 Instrucciones Para Antes de la Ciruga   Su ciruga est programada para 05/13/2024  (your procedure is scheduled on) Entre por la entrada principal del [facility]  a las 0745 de la [place] -(enter through the main entrance at [facility] at [employer] AM    [address] [place], [place] [address] para informarnos de su llegada. (pick up phone, dial [phone] on arrival)     Por favor llame al [phone] si tiene algn problema en la maana de la ciruga (please call this number if you have any problems the morning of surgery.)                  Recuerde: (Remember)  No coma alimentos. (Do not eat food (After Midnight) Desps de medianoche)    Puede tomar lquidos claros/transparentes hasta su hora de llegada a las _____0545____. Los lquidos claros son aquellos a travs de los cuales se puede ver. Pueden tener color, como la Cola o el Kool-Aid. Puede tomar t y caf, siempre y cuando no contengan leche ni ningn tipo de crema.   No use joyas, maquillaje de ojos, lpiz labial, crema para el cuerpo o esmalte de uas oscuro. (Do not wear jewelry, eye makeup, lipstick, body lotion, or dark fingernail polish). Puede usar desodorante (you may wear deodorant)    No se afeite 48 horas de su ciruga. (Do not shave 48 hours before your surgery)    No traiga objetos de valor al hospital.  [place] no se hace responsable de ninguna pertenencia, ni objetos de valor que haya trado al hospital. (Do not bring valuable to the hospital.  [place] is not responsible for any belongings or valuables brought to the hospital)   [facility] medicinas en la maana de la ciruga con un SORBITO de agua nada (take these meds the morning of surgery with a SIP of water)     Durante la ciruga no se pueden usar lentes de contacto, dentaduras o puentes. (Contacts, dentures or bridgework cannot be worn in surgery).   Si va a ser ingresado despus de la ciruga, deje la [employer] en  el carro hasta que se le haya asignado una habitacin. (If you are to be admitted after surgery, leave suitcase in car until your room has been assigned.)   A los pacientes que se les d de alta el mismo da no se les permitir manejar a casa.  (Patients discharged on the day of surgery will not be allowed to drive home)    [place] y nmero de [place] del Programmer, multimedia na. (Name and telephone number of your driver)   Instrucciones especiales Shower using CHG 2 nights before surgery and the night before surgery.  If you shower the day of surgery use CHG.  Use special wash - you have one bottle of CHG for all showers.  You should use approximately 1/3 of the bottle for each shower. (Special Instructions)   Por favor, lea las hojas informativas que le entregaron. (Please read over the following fact sheets that you were given) Surgical Site Infection Prevention

## 2024-05-01 ENCOUNTER — Telehealth (HOSPITAL_COMMUNITY): Payer: Self-pay | Admitting: *Deleted

## 2024-05-01 NOTE — Telephone Encounter (Signed)
 Preadmission screen

## 2024-05-02 ENCOUNTER — Encounter (HOSPITAL_COMMUNITY): Payer: Self-pay

## 2024-05-03 ENCOUNTER — Ambulatory Visit: Admitting: Obstetrics and Gynecology

## 2024-05-03 ENCOUNTER — Other Ambulatory Visit: Payer: Self-pay

## 2024-05-03 VITALS — BP 119/82 | HR 75 | Wt 230.4 lb

## 2024-05-03 DIAGNOSIS — O26899 Other specified pregnancy related conditions, unspecified trimester: Secondary | ICD-10-CM

## 2024-05-03 DIAGNOSIS — Z3009 Encounter for other general counseling and advice on contraception: Secondary | ICD-10-CM

## 2024-05-03 DIAGNOSIS — O09523 Supervision of elderly multigravida, third trimester: Secondary | ICD-10-CM

## 2024-05-03 DIAGNOSIS — Z6791 Unspecified blood type, Rh negative: Secondary | ICD-10-CM

## 2024-05-03 DIAGNOSIS — O0993 Supervision of high risk pregnancy, unspecified, third trimester: Secondary | ICD-10-CM

## 2024-05-03 DIAGNOSIS — Z98891 History of uterine scar from previous surgery: Secondary | ICD-10-CM

## 2024-05-03 DIAGNOSIS — Z3A35 35 weeks gestation of pregnancy: Secondary | ICD-10-CM

## 2024-05-03 DIAGNOSIS — O26893 Other specified pregnancy related conditions, third trimester: Secondary | ICD-10-CM | POA: Diagnosis not present

## 2024-05-03 DIAGNOSIS — O0990 Supervision of high risk pregnancy, unspecified, unspecified trimester: Secondary | ICD-10-CM

## 2024-05-03 DIAGNOSIS — O30043 Twin pregnancy, dichorionic/diamniotic, third trimester: Secondary | ICD-10-CM

## 2024-05-03 NOTE — Progress Notes (Signed)
   PRENATAL VISIT NOTE  Subjective:  Patricia Lam is a 38 y.o. [phone] at [redacted]w[redacted]d being seen today for ongoing prenatal care.  She is currently monitored for the following issues for this high-risk pregnancy and has Supervision of high risk pregnancy, antepartum; AMA (advanced maternal age) multigravida 35+; Rh negative state in antepartum period; Dichorionic diamniotic twin gestation; Unwanted fertility; Obesity affecting pregnancy, antepartum; History of 3 cesarean sections; Placenta accreta in second trimester; and Bacterial vaginosis on their problem list.  Patient doing well with no acute concerns today. She reports occasional braxton hicks.  Contractions: Irritability. Vag. Bleeding: None.  Movement: Present. Denies leaking of fluid.   The following portions of the patient's history were reviewed and updated as appropriate: allergies, current medications, past family history, past medical history, past social history, past surgical history and problem list. Problem list updated.  Objective:   Vitals:   05/03/24 1031  BP: 119/82  Pulse: 75  Weight: 230 lb 6.4 oz (104.5 kg)    Fetal Status: Fetal Heart Rate (bpm): 131/129 Fundal Height: 38 cm Movement: Present     General:  Alert, oriented and cooperative. Patient is in no acute distress.  Skin: Skin is warm and dry. No rash noted.   Cardiovascular: Normal heart rate noted  Respiratory: Normal respiratory effort, no problems with respiration noted  Abdomen: Soft, gravid, appropriate for gestational age.  Pain/Pressure: Present     Pelvic: Cervical exam deferred        Extremities: Normal range of motion.  Edema: Trace  Mental Status:  Normal mood and affect. Normal behavior. Normal judgment and thought content.   Assessment and Plan:  Pregnancy: G4P3003 at [redacted]w[redacted]d  1. [redacted] weeks gestation of pregnancy (Primary)   2. Supervision of high risk pregnancy, antepartum Continue routine prenatal care  3. Unwanted fertility Pt  desires tubal ligation  4. Rh negative state in antepartum period Rhogam post delivery  5. History of 3 cesarean sections Repeat c section and tubal scheduled Consent previously signed  6. Dichorionic diamniotic twin pregnancy in third trimester U/s scheduled for 05/09/24 Last scan with EFW 10% and 46%  7. Multigravida of advanced maternal age in third trimester   Preterm labor symptoms and general obstetric precautions including but not limited to vaginal bleeding, contractions, leaking of fluid and fetal movement were reviewed in detail with the patient.  Please refer to After Visit Summary for other counseling recommendations.   Return in about 1 week (around 05/10/2024) for [facility], in person.   Jerilynn Buddle, MD Faculty Attending Center for [facility]

## 2024-05-07 ENCOUNTER — Other Ambulatory Visit: Payer: Self-pay | Admitting: Family Medicine

## 2024-05-07 DIAGNOSIS — Z98891 History of uterine scar from previous surgery: Secondary | ICD-10-CM

## 2024-05-07 DIAGNOSIS — Z3009 Encounter for other general counseling and advice on contraception: Secondary | ICD-10-CM

## 2024-05-09 ENCOUNTER — Ambulatory Visit: Admitting: Maternal & Fetal Medicine

## 2024-05-09 ENCOUNTER — Ambulatory Visit: Attending: Obstetrics and Gynecology

## 2024-05-09 VITALS — BP 131/78 | HR 79

## 2024-05-09 DIAGNOSIS — O99213 Obesity complicating pregnancy, third trimester: Secondary | ICD-10-CM | POA: Diagnosis not present

## 2024-05-09 DIAGNOSIS — O34219 Maternal care for unspecified type scar from previous cesarean delivery: Secondary | ICD-10-CM

## 2024-05-09 DIAGNOSIS — O09523 Supervision of elderly multigravida, third trimester: Secondary | ICD-10-CM

## 2024-05-09 DIAGNOSIS — Z3A36 36 weeks gestation of pregnancy: Secondary | ICD-10-CM | POA: Diagnosis present

## 2024-05-09 DIAGNOSIS — Z98891 History of uterine scar from previous surgery: Secondary | ICD-10-CM | POA: Insufficient documentation

## 2024-05-09 DIAGNOSIS — O30043 Twin pregnancy, dichorionic/diamniotic, third trimester: Secondary | ICD-10-CM

## 2024-05-09 DIAGNOSIS — O35BXX1 Maternal care for other (suspected) fetal abnormality and damage, fetal cardiac anomalies, fetus 1: Secondary | ICD-10-CM

## 2024-05-09 DIAGNOSIS — O43212 Placenta accreta, second trimester: Secondary | ICD-10-CM | POA: Diagnosis present

## 2024-05-09 DIAGNOSIS — O0990 Supervision of high risk pregnancy, unspecified, unspecified trimester: Secondary | ICD-10-CM | POA: Insufficient documentation

## 2024-05-09 DIAGNOSIS — O99210 Obesity complicating pregnancy, unspecified trimester: Secondary | ICD-10-CM | POA: Diagnosis present

## 2024-05-09 NOTE — Progress Notes (Signed)
 After review, MFM consult with provider is not indicated for today  William Glenn, DO 05/09/2024 11:42 AM  [facility]

## 2024-05-10 ENCOUNTER — Ambulatory Visit: Admitting: Obstetrics & Gynecology

## 2024-05-10 ENCOUNTER — Other Ambulatory Visit (HOSPITAL_COMMUNITY)
Admission: RE | Admit: 2024-05-10 | Discharge: 2024-05-10 | Disposition: A | Discharge status: Home / Self Care | Source: Ambulatory Visit | Attending: Obstetrics & Gynecology | Admitting: Obstetrics & Gynecology

## 2024-05-10 ENCOUNTER — Other Ambulatory Visit: Payer: Self-pay

## 2024-05-10 ENCOUNTER — Encounter (HOSPITAL_COMMUNITY)
Admission: RE | Admit: 2024-05-10 | Discharge: 2024-05-10 | Disposition: A | Discharge status: Home / Self Care | Source: Ambulatory Visit | Attending: Family Medicine | Admitting: Family Medicine

## 2024-05-10 VITALS — BP 128/86 | HR 80 | Wt 233.9 lb

## 2024-05-10 DIAGNOSIS — Z98891 History of uterine scar from previous surgery: Secondary | ICD-10-CM

## 2024-05-10 DIAGNOSIS — O09523 Supervision of elderly multigravida, third trimester: Secondary | ICD-10-CM

## 2024-05-10 DIAGNOSIS — Z3A36 36 weeks gestation of pregnancy: Secondary | ICD-10-CM

## 2024-05-10 DIAGNOSIS — O30043 Twin pregnancy, dichorionic/diamniotic, third trimester: Secondary | ICD-10-CM | POA: Diagnosis not present

## 2024-05-10 DIAGNOSIS — O0993 Supervision of high risk pregnancy, unspecified, third trimester: Secondary | ICD-10-CM | POA: Diagnosis not present

## 2024-05-10 DIAGNOSIS — Z01812 Encounter for preprocedural laboratory examination: Secondary | ICD-10-CM | POA: Insufficient documentation

## 2024-05-10 DIAGNOSIS — O0990 Supervision of high risk pregnancy, unspecified, unspecified trimester: Secondary | ICD-10-CM | POA: Insufficient documentation

## 2024-05-10 LAB — CBC
HCT: 36.5 % (ref 36.0–46.0)
Hemoglobin: 12.3 g/dL (ref 12.0–15.0)
MCH: 31.5 pg (ref 26.0–34.0)
MCHC: 33.7 g/dL (ref 30.0–36.0)
MCV: 93.6 fL (ref 80.0–100.0)
Platelets: 132 K/uL — ABNORMAL LOW (ref 150–400)
RBC: 3.9 MIL/uL (ref 3.87–5.11)
RDW: 13.8 % (ref 11.5–15.5)
WBC: 8.4 K/uL (ref 4.0–10.5)
nRBC: 0 % (ref 0.0–0.2)

## 2024-05-10 NOTE — Progress Notes (Signed)
   PRENATAL VISIT NOTE  Subjective:  Patricia Lam is a 38 y.o. [phone] at [redacted]w[redacted]d being seen today for ongoing prenatal care.  She is currently monitored for the following issues for this high-risk pregnancy and has Supervision of high risk pregnancy, antepartum; AMA (advanced maternal age) multigravida 35+; Rh negative state in antepartum period; Dichorionic diamniotic twin gestation; Unwanted fertility; Obesity affecting pregnancy, antepartum; History of 3 cesarean sections; Myometrial thinning of lower segment in second trimester; and Bacterial vaginosis on their problem list.  Patient reports no leaking and vaginal irritation.  Contractions: Irritability. Vag. Bleeding: None.  Movement: Present. Denies leaking of fluid.   The following portions of the patient's history were reviewed and updated as appropriate: allergies, current medications, past family history, past medical history, past social history, past surgical history and problem list.   Objective:    Vitals:   05/10/24 1020  BP: 128/86  Pulse: 80  Weight: 233 lb 14.4 oz (106.1 kg)    Fetal Status:  Fetal Heart Rate (bpm): 135/140   Movement: Present    General: Alert, oriented and cooperative. Patient is in no acute distress.  Skin: Skin is warm and dry. No rash noted.   Cardiovascular: Normal heart rate noted  Respiratory: Normal respiratory effort, no problems with respiration noted  Abdomen: Soft, gravid, appropriate for gestational age.  Pain/Pressure: Present     Pelvic: Cervical exam deferred        Extremities: Normal range of motion.  Edema: Trace  Mental Status: Normal mood and affect. Normal behavior. Normal judgment and thought content.   Assessment and Plan:  Pregnancy: G4P3003 at [redacted]w[redacted]d 1. History of 3 cesarean sections (Primary) Repeat at 37 weeks  2. Dichorionic diamniotic twin pregnancy in third trimester   3. Supervision of high risk pregnancy, antepartum   4. [redacted] weeks gestation of  pregnancy   5. Multigravida of advanced maternal age in third trimester No orders of the defined types were placed in this encounter.    Preterm labor symptoms and general obstetric precautions including but not limited to vaginal bleeding, contractions, leaking of fluid and fetal movement were reviewed in detail with the patient. Please refer to After Visit Summary for other counseling recommendations.   Return if symptoms worsen or fail to improve.  Future Appointments  Date Time Provider Department Center  05/17/2024 10:15 AM Lola Donnice HERO, MD [facility]  05/24/2024 10:15 AM Lola Donnice HERO, MD [facility]  05/31/2024 10:15 AM Lola Donnice HERO, MD [facility]    Lynwood Solomons, MD

## 2024-05-10 NOTE — Anesthesia Preprocedure Evaluation (Addendum)
 Anesthesia Evaluation  Patient identified by MRN, date of birth, ID band Patient awake    Reviewed: Allergy & Precautions, NPO status , Patient's Chart, lab work & pertinent test results  Airway Mallampati: III  TM Distance: >3 FB Neck ROM: Full    Dental  (+) Teeth Intact, Dental Advisory Given   Pulmonary neg pulmonary ROS   Pulmonary exam normal breath sounds clear to auscultation       Cardiovascular negative cardio ROS Normal cardiovascular exam Rhythm:Regular Rate:Normal     Neuro/Psych negative neurological ROS  negative psych ROS   GI/Hepatic Neg liver ROS,GERD  Medicated and Controlled,,  Endo/Other    Class 3 obesity (BMI 40)  Renal/GU negative Renal ROS  negative genitourinary   Musculoskeletal negative musculoskeletal ROS (+)    Abdominal  (+) + obese  Peds  Hematology Hb 12.3, plt 132   Anesthesia Other Findings   Reproductive/Obstetrics (+) Pregnancy Twins- 37 week section recommended for suspicion of thinning of lower uterine segment per [facility] History of 3 cesarean sections; Placenta accreta in second trimester (resolved on subsequent scans) Undesired fertility                              Anesthesia Physical Anesthesia Plan  ASA: 3  Anesthesia Plan: Combined Spinal and Epidural   Post-op Pain Management: Ofirmev  IV (intra-op)*, Toradol  IV (intra-op)* and Regional block   Induction:   PONV Risk Score and Plan: 3 and Ondansetron , Dexamethasone  and Treatment may vary due to age or medical condition  Airway Management Planned: Natural Airway and Nasal Cannula  Additional Equipment: None  Intra-op Plan:   Post-operative Plan:   Informed Consent: I have reviewed the patients History and Physical, chart, labs and discussed the procedure including the risks, benefits and alternatives for the proposed anesthesia with the patient or authorized representative who has  indicated his/her understanding and acceptance.       Plan Discussed with: CRNA  Anesthesia Plan Comments: (Issues w/ spinal level being inadequate last time (2018) requiring two separate spinal attempts- will do CSE to ensure adequate level this time.)         Anesthesia Quick Evaluation

## 2024-05-11 LAB — TYPE AND SCREEN
ABO/RH(D): O NEG
Antibody Screen: POSITIVE

## 2024-05-11 LAB — RPR: RPR Ser Ql: NONREACTIVE

## 2024-05-13 ENCOUNTER — Inpatient Hospital Stay (HOSPITAL_COMMUNITY): Payer: Self-pay | Admitting: Anesthesiology

## 2024-05-13 ENCOUNTER — Inpatient Hospital Stay (HOSPITAL_COMMUNITY)
Admission: AD | Admit: 2024-05-13 | Discharge: 2024-05-15 | DRG: 784 | Disposition: A | Discharge status: Home / Self Care | Attending: Family Medicine | Admitting: Family Medicine

## 2024-05-13 ENCOUNTER — Encounter (HOSPITAL_COMMUNITY)
Admission: AD | Disposition: A | Discharge status: Home / Self Care | Payer: Self-pay | Source: Home / Self Care | Attending: Family Medicine

## 2024-05-13 ENCOUNTER — Encounter (HOSPITAL_COMMUNITY): Payer: Self-pay | Admitting: Family Medicine

## 2024-05-13 ENCOUNTER — Other Ambulatory Visit: Payer: Self-pay

## 2024-05-13 DIAGNOSIS — Z3A37 37 weeks gestation of pregnancy: Secondary | ICD-10-CM

## 2024-05-13 DIAGNOSIS — Z302 Encounter for sterilization: Secondary | ICD-10-CM

## 2024-05-13 DIAGNOSIS — O9081 Anemia of the puerperium: Secondary | ICD-10-CM | POA: Diagnosis not present

## 2024-05-13 DIAGNOSIS — O26893 Other specified pregnancy related conditions, third trimester: Secondary | ICD-10-CM | POA: Diagnosis present

## 2024-05-13 DIAGNOSIS — O0990 Supervision of high risk pregnancy, unspecified, unspecified trimester: Principal | ICD-10-CM

## 2024-05-13 DIAGNOSIS — O30043 Twin pregnancy, dichorionic/diamniotic, third trimester: Secondary | ICD-10-CM

## 2024-05-13 DIAGNOSIS — Z98891 History of uterine scar from previous surgery: Principal | ICD-10-CM

## 2024-05-13 DIAGNOSIS — E66813 Obesity, class 3: Secondary | ICD-10-CM | POA: Diagnosis present

## 2024-05-13 DIAGNOSIS — O34211 Maternal care for low transverse scar from previous cesarean delivery: Secondary | ICD-10-CM

## 2024-05-13 DIAGNOSIS — O26899 Other specified pregnancy related conditions, unspecified trimester: Secondary | ICD-10-CM

## 2024-05-13 DIAGNOSIS — Z8249 Family history of ischemic heart disease and other diseases of the circulatory system: Secondary | ICD-10-CM | POA: Diagnosis not present

## 2024-05-13 DIAGNOSIS — O09529 Supervision of elderly multigravida, unspecified trimester: Secondary | ICD-10-CM

## 2024-05-13 DIAGNOSIS — Z555 Less than a high school diploma: Secondary | ICD-10-CM | POA: Diagnosis not present

## 2024-05-13 DIAGNOSIS — D62 Acute posthemorrhagic anemia: Secondary | ICD-10-CM | POA: Diagnosis not present

## 2024-05-13 DIAGNOSIS — K219 Gastro-esophageal reflux disease without esophagitis: Secondary | ICD-10-CM | POA: Diagnosis present

## 2024-05-13 DIAGNOSIS — O99214 Obesity complicating childbirth: Secondary | ICD-10-CM | POA: Diagnosis present

## 2024-05-13 DIAGNOSIS — O9962 Diseases of the digestive system complicating childbirth: Secondary | ICD-10-CM | POA: Diagnosis present

## 2024-05-13 DIAGNOSIS — Z603 Acculturation difficulty: Secondary | ICD-10-CM | POA: Diagnosis present

## 2024-05-13 DIAGNOSIS — Z6791 Unspecified blood type, Rh negative: Secondary | ICD-10-CM | POA: Diagnosis not present

## 2024-05-13 DIAGNOSIS — Z7982 Long term (current) use of aspirin: Secondary | ICD-10-CM | POA: Diagnosis not present

## 2024-05-13 DIAGNOSIS — O43212 Placenta accreta, second trimester: Secondary | ICD-10-CM | POA: Diagnosis present

## 2024-05-13 DIAGNOSIS — O30049 Twin pregnancy, dichorionic/diamniotic, unspecified trimester: Secondary | ICD-10-CM | POA: Diagnosis present

## 2024-05-13 DIAGNOSIS — O99210 Obesity complicating pregnancy, unspecified trimester: Secondary | ICD-10-CM | POA: Diagnosis present

## 2024-05-13 DIAGNOSIS — O09523 Supervision of elderly multigravida, third trimester: Secondary | ICD-10-CM

## 2024-05-13 DIAGNOSIS — Z3009 Encounter for other general counseling and advice on contraception: Secondary | ICD-10-CM | POA: Diagnosis present

## 2024-05-13 HISTORY — PX: TUBAL LIGATION: SHX77

## 2024-05-13 LAB — CERVICOVAGINAL ANCILLARY ONLY
Bacterial Vaginitis (gardnerella): POSITIVE — AB
Candida Glabrata: NEGATIVE
Candida Vaginitis: NEGATIVE
Chlamydia: NEGATIVE
Comment: NEGATIVE
Comment: NEGATIVE
Comment: NEGATIVE
Comment: NEGATIVE
Comment: NORMAL
Neisseria Gonorrhea: NEGATIVE

## 2024-05-13 LAB — CBC
HCT: 38 % (ref 36.0–46.0)
Hemoglobin: 13.3 g/dL (ref 12.0–15.0)
MCH: 31.6 pg (ref 26.0–34.0)
MCHC: 35 g/dL (ref 30.0–36.0)
MCV: 90.3 fL (ref 80.0–100.0)
Platelets: 124 K/uL — ABNORMAL LOW (ref 150–400)
RBC: 4.21 MIL/uL (ref 3.87–5.11)
RDW: 13.2 % (ref 11.5–15.5)
WBC: 15.9 K/uL — ABNORMAL HIGH (ref 4.0–10.5)
nRBC: 0 % (ref 0.0–0.2)

## 2024-05-13 LAB — CREATININE, SERUM
Creatinine, Ser: 0.69 mg/dL (ref 0.44–1.00)
GFR, Estimated: >60 mL/min (ref >60)

## 2024-05-13 SURGERY — Surgical Case
Anesthesia: Spinal

## 2024-05-13 MED ORDER — NALOXONE HCL 4 MG/10ML IJ SOLN: 1.0000 ug/kg/h | INTRAVENOUS | Status: DC | PRN

## 2024-05-13 MED ORDER — SCOPOLAMINE 1 MG/3DAYS TD PT72: 1.0000 | MEDICATED_PATCH | Freq: Once | TRANSDERMAL | Status: DC

## 2024-05-13 MED ORDER — ENOXAPARIN SODIUM 60 MG/0.6ML IJ SOSY
50.0000 mg | PREFILLED_SYRINGE | INTRAMUSCULAR | Status: DC
  Administered 2024-05-14 – 2024-05-15 (×4): 50 mg via SUBCUTANEOUS
  Filled 2024-05-13 (×2): qty 0.6

## 2024-05-13 MED ORDER — METHYLERGONOVINE MALEATE 0.2 MG/ML IJ SOLN
INTRAMUSCULAR | Status: AC
  Filled 2024-05-13: qty 1

## 2024-05-13 MED ORDER — HYDROMORPHONE HCL 1 MG/ML IJ SOLN: 0.2500 mg | INTRAMUSCULAR | Status: DC | PRN

## 2024-05-13 MED ORDER — KETOROLAC TROMETHAMINE 30 MG/ML IJ SOLN: 30.0000 mg | Freq: Four times a day (QID) | INTRAMUSCULAR | Status: AC | PRN

## 2024-05-13 MED ORDER — MORPHINE SULFATE (PF) 0.5 MG/ML IJ SOLN
INTRAMUSCULAR | Status: AC
  Filled 2024-05-13: qty 10

## 2024-05-13 MED ORDER — NALOXONE HCL 0.4 MG/ML IJ SOLN: 0.4000 mg | INTRAMUSCULAR | Status: DC | PRN

## 2024-05-13 MED ORDER — COCONUT OIL OIL: 1.0000 | TOPICAL_OIL | Status: DC | PRN

## 2024-05-13 MED ORDER — DIPHENHYDRAMINE HCL 25 MG PO CAPS: 25.0000 mg | ORAL_CAPSULE | Freq: Four times a day (QID) | ORAL | Status: DC | PRN

## 2024-05-13 MED ORDER — DIPHENHYDRAMINE HCL 25 MG PO CAPS: 25.0000 mg | ORAL_CAPSULE | ORAL | Status: DC | PRN

## 2024-05-13 MED ORDER — MEPERIDINE HCL 25 MG/ML IJ SOLN: 6.2500 mg | INTRAMUSCULAR | Status: DC | PRN

## 2024-05-13 MED ORDER — SODIUM CHLORIDE 0.9% FLUSH: 3.0000 mL | INTRAVENOUS | Status: DC | PRN

## 2024-05-13 MED ORDER — OXYTOCIN-SODIUM CHLORIDE 30-0.9 UT/500ML-% IV SOLN
2.5000 [IU]/h | INTRAVENOUS | Status: AC
  Administered 2024-05-13 (×2): 2.5 [IU]/h via INTRAVENOUS
  Filled 2024-05-13: qty 500

## 2024-05-13 MED ORDER — SIMETHICONE 80 MG PO CHEW: 80.0000 mg | CHEWABLE_TABLET | ORAL | Status: DC | PRN

## 2024-05-13 MED ORDER — PHENYLEPHRINE HCL-NACL 20-0.9 MG/250ML-% IV SOLN
INTRAVENOUS | Status: DC | PRN
  Administered 2024-05-13 (×2): 60 ug/min via INTRAVENOUS

## 2024-05-13 MED ORDER — KETOROLAC TROMETHAMINE 30 MG/ML IJ SOLN
30.0000 mg | Freq: Four times a day (QID) | INTRAMUSCULAR | Status: AC
  Administered 2024-05-13 – 2024-05-14 (×6): 30 mg via INTRAVENOUS
  Filled 2024-05-13 (×3): qty 1

## 2024-05-13 MED ORDER — ONDANSETRON HCL 4 MG/2ML IJ SOLN: 4.0000 mg | Freq: Three times a day (TID) | INTRAMUSCULAR | Status: DC | PRN

## 2024-05-13 MED ORDER — OXYCODONE HCL 5 MG PO TABS: 5.0000 mg | ORAL_TABLET | Freq: Once | ORAL | Status: DC | PRN

## 2024-05-13 MED ORDER — ACETAMINOPHEN 500 MG PO TABS
1000.0000 mg | ORAL_TABLET | Freq: Four times a day (QID) | ORAL | Status: AC
  Administered 2024-05-13 – 2024-05-14 (×6): 1000 mg via ORAL
  Filled 2024-05-13 (×3): qty 2

## 2024-05-13 MED ORDER — DEXAMETHASONE SODIUM PHOSPHATE 10 MG/ML IJ SOLN
INTRAMUSCULAR | Status: DC | PRN
  Administered 2024-05-13 (×2): 10 mg via INTRAVENOUS

## 2024-05-13 MED ORDER — DIBUCAINE (PERIANAL) 1 % EX OINT: 1.0000 | TOPICAL_OINTMENT | CUTANEOUS | Status: DC | PRN

## 2024-05-13 MED ORDER — ONDANSETRON HCL 4 MG/2ML IJ SOLN: 4.0000 mg | Freq: Once | INTRAMUSCULAR | Status: DC | PRN

## 2024-05-13 MED ORDER — ZOLPIDEM TARTRATE 5 MG PO TABS: 5.0000 mg | ORAL_TABLET | Freq: Every evening | ORAL | Status: DC | PRN

## 2024-05-13 MED ORDER — CEFAZOLIN SODIUM-DEXTROSE 2-4 GM/100ML-% IV SOLN
2.0000 g | INTRAVENOUS | Status: AC
  Administered 2024-05-13 (×2): 2 g via INTRAVENOUS

## 2024-05-13 MED ORDER — CEFAZOLIN SODIUM-DEXTROSE 2-4 GM/100ML-% IV SOLN
INTRAVENOUS | Status: AC
  Filled 2024-05-13: qty 100

## 2024-05-13 MED ORDER — TRANEXAMIC ACID-NACL 1000-0.7 MG/100ML-% IV SOLN
INTRAVENOUS | Status: DC | PRN
  Administered 2024-05-13 (×4): 1000 mg via INTRAVENOUS

## 2024-05-13 MED ORDER — ACETAMINOPHEN 10 MG/ML IV SOLN
INTRAVENOUS | Status: DC | PRN
  Administered 2024-05-13 (×2): 1000 mg via INTRAVENOUS

## 2024-05-13 MED ORDER — ONDANSETRON HCL 4 MG/2ML IJ SOLN
INTRAMUSCULAR | Status: AC
  Filled 2024-05-13: qty 2

## 2024-05-13 MED ORDER — OXYTOCIN-SODIUM CHLORIDE 30-0.9 UT/500ML-% IV SOLN
INTRAVENOUS | Status: DC | PRN
  Administered 2024-05-13 (×2): 300 mL via INTRAVENOUS

## 2024-05-13 MED ORDER — SODIUM CHLORIDE 0.9 % IR SOLN
Status: DC | PRN
  Administered 2024-05-13 (×2): 1

## 2024-05-13 MED ORDER — ONDANSETRON HCL 4 MG/2ML IJ SOLN
INTRAMUSCULAR | Status: DC | PRN
  Administered 2024-05-13 (×2): 4 mg via INTRAVENOUS

## 2024-05-13 MED ORDER — LACTATED RINGERS IV SOLN: INTRAVENOUS | Status: DC

## 2024-05-13 MED ORDER — OXYCODONE HCL 5 MG/5ML PO SOLN: 5.0000 mg | Freq: Once | ORAL | Status: DC | PRN

## 2024-05-13 MED ORDER — KETOROLAC TROMETHAMINE 30 MG/ML IJ SOLN: 30.0000 mg | Freq: Once | INTRAMUSCULAR | Status: DC | PRN

## 2024-05-13 MED ORDER — TRANEXAMIC ACID-NACL 1000-0.7 MG/100ML-% IV SOLN
INTRAVENOUS | Status: AC
  Filled 2024-05-13: qty 100

## 2024-05-13 MED ORDER — DIPHENHYDRAMINE HCL 50 MG/ML IJ SOLN: 12.5000 mg | INTRAMUSCULAR | Status: DC | PRN

## 2024-05-13 MED ORDER — FENTANYL CITRATE (PF) 100 MCG/2ML IJ SOLN
INTRAMUSCULAR | Status: AC
Start: 2024-05-13 — End: 2024-05-13
  Filled 2024-05-13: qty 2

## 2024-05-13 MED ORDER — POVIDONE-IODINE 10 % EX SWAB: 2.0000 | Freq: Once | CUTANEOUS | Status: DC

## 2024-05-13 MED ORDER — METHYLERGONOVINE MALEATE 0.2 MG/ML IJ SOLN
0.2000 mg | Freq: Once | INTRAMUSCULAR | Status: AC
  Administered 2024-05-13 (×2): 0.2 mg via INTRAMUSCULAR

## 2024-05-13 MED ORDER — OXYCODONE HCL 5 MG PO TABS: 5.0000 mg | ORAL_TABLET | ORAL | Status: DC | PRN

## 2024-05-13 MED ORDER — MORPHINE SULFATE (PF) 0.5 MG/ML IJ SOLN
INTRAMUSCULAR | Status: DC | PRN
  Administered 2024-05-13 (×2): .15 mg via INTRATHECAL

## 2024-05-13 MED ORDER — FENTANYL CITRATE (PF) 100 MCG/2ML IJ SOLN
INTRAMUSCULAR | Status: DC | PRN
  Administered 2024-05-13 (×2): 15 ug via INTRATHECAL

## 2024-05-13 MED ORDER — DEXAMETHASONE SODIUM PHOSPHATE 10 MG/ML IJ SOLN
INTRAMUSCULAR | Status: AC
  Filled 2024-05-13: qty 1

## 2024-05-13 MED ORDER — ACETAMINOPHEN 10 MG/ML IV SOLN
INTRAVENOUS | Status: AC
  Filled 2024-05-13: qty 100

## 2024-05-13 MED ORDER — MENTHOL 3 MG MT LOZG: 1.0000 | LOZENGE | OROMUCOSAL | Status: DC | PRN

## 2024-05-13 MED ORDER — AMISULPRIDE (ANTIEMETIC) 5 MG/2ML IV SOLN: 10.0000 mg | Freq: Once | INTRAVENOUS | Status: DC | PRN

## 2024-05-13 MED ORDER — SENNOSIDES-DOCUSATE SODIUM 8.6-50 MG PO TABS
2.0000 | ORAL_TABLET | Freq: Every day | ORAL | Status: DC
  Administered 2024-05-14 – 2024-05-15 (×4): 2 via ORAL
  Filled 2024-05-13 (×2): qty 2

## 2024-05-13 MED ORDER — IBUPROFEN 600 MG PO TABS
600.0000 mg | ORAL_TABLET | Freq: Four times a day (QID) | ORAL | Status: DC
  Administered 2024-05-14 – 2024-05-15 (×10): 600 mg via ORAL
  Filled 2024-05-13 (×6): qty 1

## 2024-05-13 MED ORDER — WITCH HAZEL-GLYCERIN EX PADS: 1.0000 | MEDICATED_PAD | CUTANEOUS | Status: DC | PRN

## 2024-05-13 MED ORDER — BUPIVACAINE IN DEXTROSE 0.75-8.25 % IT SOLN
INTRATHECAL | Status: DC | PRN
Start: 2024-05-13 — End: 2024-05-13
  Administered 2024-05-13 (×2): 1.8 mL via INTRATHECAL

## 2024-05-13 MED ORDER — STERILE WATER FOR IRRIGATION IR SOLN
Status: DC | PRN
  Administered 2024-05-13 (×2): 1

## 2024-05-13 MED ORDER — KETOROLAC TROMETHAMINE 30 MG/ML IJ SOLN
INTRAMUSCULAR | Status: DC | PRN
  Administered 2024-05-13 (×2): 30 mg via INTRAVENOUS

## 2024-05-13 MED ORDER — PRENATAL MULTIVITAMIN CH
1.0000 | ORAL_TABLET | Freq: Every day | ORAL | Status: DC
  Administered 2024-05-13 – 2024-05-15 (×6): 1 via ORAL
  Filled 2024-05-13 (×3): qty 1

## 2024-05-13 MED ORDER — SIMETHICONE 80 MG PO CHEW
80.0000 mg | CHEWABLE_TABLET | Freq: Three times a day (TID) | ORAL | Status: DC
  Administered 2024-05-13 – 2024-05-15 (×14): 80 mg via ORAL
  Filled 2024-05-13 (×8): qty 1

## 2024-05-13 SURGICAL SUPPLY — 31 items
CATH ROBINSON RED A/P 16FR (CATHETERS) IMPLANT
CHLORAPREP W/TINT 26ML (MISCELLANEOUS) ×4 IMPLANT
CLAMP UMBILICAL CORD (MISCELLANEOUS) ×2 IMPLANT
CLOTH BEACON ORANGE TIMEOUT ST (SAFETY) ×2 IMPLANT
DERMABOND ADVANCED .7 DNX12 (GAUZE/BANDAGES/DRESSINGS) ×4 IMPLANT
DRSG OPSITE POSTOP 4X10 (GAUZE/BANDAGES/DRESSINGS) ×2 IMPLANT
ELECTRODE REM PT RTRN 9FT ADLT (ELECTROSURGICAL) ×2 IMPLANT
EXTRACTOR VACUUM M CUP 4 TUBE (SUCTIONS) IMPLANT
GLOVE BIOGEL PI IND STRL 7.0 (GLOVE) ×2 IMPLANT
GLOVE BIOGEL PI IND STRL 7.5 (GLOVE) ×2 IMPLANT
GLOVE ECLIPSE 7.5 STRL STRAW (GLOVE) ×2 IMPLANT
GOWN STRL REUS W/TWL LRG LVL3 (GOWN DISPOSABLE) ×6 IMPLANT
KIT ABG SYR 3ML LUER SLIP (SYRINGE) IMPLANT
MAT PREVALON FULL STRYKER (MISCELLANEOUS) IMPLANT
NDL HYPO 25X5/8 SAFETYGLIDE (NEEDLE) IMPLANT
NEEDLE HYPO 25X5/8 SAFETYGLIDE (NEEDLE) IMPLANT
NS IRRIG 1000ML POUR BTL (IV SOLUTION) ×2 IMPLANT
PACK C SECTION WH (CUSTOM PROCEDURE TRAY) ×2 IMPLANT
PAD OB MATERNITY 4.3X12.25 (PERSONAL CARE ITEMS) ×2 IMPLANT
PENCIL SMOKE EVAC W/HOLSTER (ELECTROSURGICAL) ×2 IMPLANT
RETRACTOR TRAXI PANNICULUS (MISCELLANEOUS) IMPLANT
RTRCTR C-SECT PINK 25CM LRG (MISCELLANEOUS) ×2 IMPLANT
SUT MNCRL 0 VIOLET CTX 36 (SUTURE) ×4 IMPLANT
SUT PLAIN ABS 2-0 54XMFL TIE (SUTURE) ×2 IMPLANT
SUT PLAIN ABS 2-0 CT1 27XMFL (SUTURE) IMPLANT
SUT VIC AB 0 CTX36XBRD ANBCTRL (SUTURE) ×2 IMPLANT
SUT VIC AB 2-0 CT1 TAPERPNT 27 (SUTURE) ×2 IMPLANT
SUT VIC AB 4-0 KS 27 (SUTURE) ×2 IMPLANT
TOWEL OR 17X24 6PK STRL BLUE (TOWEL DISPOSABLE) ×2 IMPLANT
TRAY FOLEY W/BAG SLVR 14FR LF (SET/KITS/TRAYS/PACK) ×2 IMPLANT
WATER STERILE IRR 1000ML POUR (IV SOLUTION) ×2 IMPLANT

## 2024-05-13 NOTE — Transfer of Care (Signed)
 Immediate Anesthesia Transfer of Care Note  Patient: Patricia Lam  Procedure(s) Performed: CESAREAN SECTION, MULTIPLE FETUSES LIGATION, FALLOPIAN TUBE, BILATERAL (Bilateral)  Patient Location: PACU  Anesthesia Type:Spinal and Epidural  Level of Consciousness: awake, alert , and oriented  Airway & Oxygen Therapy: Patient Spontanous Breathing  Post-op Assessment: Report given to RN and Post -op Vital signs reviewed and stable  Post vital signs: Reviewed and stable  Last Vitals:  Vitals Value Taken Time  BP 122/75 05/13/24 11:50  Temp    Pulse 74 05/13/24 11:53  Resp 17 05/13/24 11:53  SpO2 99 % 05/13/24 11:53  Vitals shown include unfiled device data.  Last Pain:  Vitals:   05/13/24 0806  TempSrc: Oral  PainSc:          Complications: No notable events documented.

## 2024-05-13 NOTE — Anesthesia Postprocedure Evaluation (Signed)
 Anesthesia Post Note  Patient: Patricia Lam  Procedure(s) Performed: CESAREAN SECTION, MULTIPLE FETUSES LIGATION, FALLOPIAN TUBE, BILATERAL (Bilateral)     Patient location during evaluation: PACU Anesthesia Type: Spinal Level of consciousness: awake and alert and oriented Pain management: pain level controlled Vital Signs Assessment: post-procedure vital signs reviewed and stable Respiratory status: spontaneous breathing, nonlabored ventilation and respiratory function stable Cardiovascular status: blood pressure returned to baseline and stable Postop Assessment: no headache, no backache, spinal receding and no apparent nausea or vomiting Anesthetic complications: no   No notable events documented.  Last Vitals:  Vitals:   05/13/24 1520 05/13/24 1622  BP: 132/76 136/73  Pulse: 69 72  Resp: 18 18  Temp: 36.6 C 36.7 C  SpO2: 98% 95%    Last Pain:  Vitals:   05/13/24 1624  TempSrc:   PainSc: 0-No pain                 [name]

## 2024-05-13 NOTE — Anesthesia Procedure Notes (Signed)
 Epidural Patient location during procedure: OB Start time: 05/13/2024 9:51 AM End time: 05/13/2024 9:56 AM  Staffing Anesthesiologist: Merla Almarie HERO, DO Performed: anesthesiologist   Preanesthetic Checklist Completed: patient identified, IV checked, risks and benefits discussed, monitors and equipment checked, pre-op evaluation and timeout performed  Epidural Patient position: sitting Prep: DuraPrep and site prepped and draped Patient monitoring: continuous pulse ox, blood pressure, heart rate and cardiac monitor Approach: midline Location: L3-L4 Injection technique: LOR air  Needle:  Needle type: Tuohy  Needle gauge: 17 G Needle length: 9 cm Needle insertion depth: 6 cm Catheter type: closed end flexible Catheter size: 19 Gauge Catheter at skin depth: 11 cm Test dose: negative  Assessment Sensory level: T8 Events: blood not aspirated, no cerebrospinal fluid, injection not painful, no injection resistance, no paresthesia and negative IV test  Additional Notes Patient identified. Risks/Benefits/Options discussed with patient including but not limited to bleeding, infection, nerve damage, paralysis, failed block, incomplete pain control, headache, blood pressure changes, nausea, vomiting, reactions to medication both or allergic, itching and postpartum back pain. Confirmed with bedside nurse the patient's most recent platelet count. Confirmed with patient that they are not currently taking any anticoagulation, have any bleeding history or any family history of bleeding disorders. Patient expressed understanding and wished to proceed. All questions were answered. Sterile technique was used throughout the entire procedure. Please see nursing notes for vital signs. Test dose was given through epidural catheter and negative prior to continuing to dose epidural or start infusion. Warning signs of high block given to the patient including shortness of breath, tingling/numbness in  hands, complete motor block, or any concerning symptoms with instructions to call for help. Patient was given instructions on fall risk and not to get out of bed. All questions and concerns addressed with instructions to call with any issues or inadequate analgesia.    CSE performed with 24G pencan through tuohy, clear CSF, no issues. Reason for block:procedure for pain

## 2024-05-13 NOTE — Op Note (Addendum)
 Hadassah KANDICE Adrien [place] PROCEDURE DATE: 05/13/2024  PREOPERATIVE DIAGNOSES: Intrauterine pregnancy at [redacted]w[redacted]d di/di twin gestation; previous uterine incision 3 prior CS, desire for sterilization  POSTOPERATIVE DIAGNOSES: The same  PROCEDURE: Repeat Low Transverse Cesarean Section and b/l salpingectomy  SURGEON:  Dr. Lang Peel  ASSISTANT:  Barabara Maier DO, Mardy Shropshire MD, Vina Solian, MD An experienced assistant was required given the standard of surgical care given the complexity of the case.  This assistant was needed for exposure, dissection, suctioning, retraction, instrument exchange, and for overall help during the procedure.   ANESTHESIOLOGY TEAM: Anesthesiologist: Merla Almarie HERO, DO CRNA: Ellison Elida RAMAN, CRNA  INDICATIONS: Jameisha Stofko is a 38 y.o. [phone] at [redacted]w[redacted]d here for cesarean section secondary to the indications listed under preoperative diagnoses; please see preoperative note for further details.  The risks of surgery were discussed with the patient including but were not limited to: bleeding which may require transfusion or reoperation; infection which may require antibiotics; injury to bowel, bladder, ureters or other surrounding organs; injury to the fetus; need for additional procedures including hysterectomy in the event of a life-threatening hemorrhage; formation of adhesions; placental abnormalities wth subsequent pregnancies; incisional problems; thromboembolic phenomenon and other postoperative/anesthesia complications.  The patient concurred with the proposed plan, giving informed written consent for the procedure.    FINDINGS:  Viable female infant in cephalic presentation.  Apgars 8 and 9.  Clear amniotic fluid.  Intact placenta, three vessel cord.   Viable female infant in cephalic presentation.  Apgars 9 and 9.  Clear amniotic fluid.  Intact placenta, three vessel cord.  Moderate adhesive disease. Normal uterus, fallopian tubes and ovaries  bilaterally.  ANESTHESIA: Spinal INTRAVENOUS FLUIDS: 800 ml   ESTIMATED BLOOD LOSS: 907 ml URINE OUTPUT:  100 ml SPECIMENS: Placenta sent to L&D, Fallopian tubes to pathology COMPLICATIONS: None immediate  PROCEDURE IN DETAIL:  The patient preoperatively received intravenous antibiotics and had sequential compression devices applied to her lower extremities.  She was then taken to the operating room where spinal anesthesia was administered was administed. She was then placed in a dorsal supine position with a leftward tilt, and prepped and draped in a sterile manner.  A foley catheter was placed into her bladder and attached to constant gravity.    After an adequate timeout was performed, a Pfannenstiel skin incision was made with scalpel on her preexisting scar and carried through to the underlying layer of fascia. The fascia was incised in the midline, and this incision was extended bilaterally.  The rectus muscles were separated in the midline and the peritoneum was entered bluntly. The Alexis self-retaining retractor was introduced into the abdominal cavity.  Attention was turned to the lower uterine segment where a low transverse hysterotomy was made with a scalpel and extended bilaterally bluntly. The infant A female was successfully delivered cephalic, the cord was clamped and cut after one minute, and the infant was handed over to the awaiting neonatology team. The infant B female was successfully delivered cephalic, the cord was clamped and cut after one minute, and the infant was handed over to the awaiting neonatology team. Uterine massage was then administered, and the placenta delivered intact with a three-vessel cord. The uterus was exteriorized and then cleared of clots and debris.    The hysterotomy was closed with 0 Vicryl in a running locked fashion. Incision inspected and hemostatic.The pelvis was cleared of all clot and debris. The uterus was returned to the abdomen and inspected and  hemostatic.    Attention was then turned to the patient's uterus, the right fallopian tube was grasped with Babcock clamps and followed out to the fimbriated end. The tube and mesosalpinx removed with Ligasure cautery.The pedicle was inspected and found to be hemostatic.  A similar process was carried out on the left side allowing for bilateral tubal sterilization.  Good hemostasis was noted overall.   The retractor was removed.  The peritoneum was closed with a 0 Vicryl running stitch. The fascia was then closed using 0 Vicryl in a running fashion.  The subcutaneous layer was irrigated and everything was hemostatic  and due to depth of tissue, it was re-approximated with 2-0 plain gut in a running fashion. The skin was closed with a 4-0 monocryl subcuticular stitch. The patient tolerated the procedure well. Sponge, instrument and needle counts were correct x 3.  She was taken to the recovery room in stable condition.    Barabara Maier, DO OB Fellow

## 2024-05-13 NOTE — Discharge Summary (Signed)
 Postpartum Discharge Summary     Patient Name: Patricia Lam DOB: [birth year] MRN: [id number]  Date of admission: 05/13/2024 Delivery date:   Dell, Hurtubise [[id number]]  05/13/2024    Simar, Pothier [[id number]]  05/13/2024 Delivering provider:    Adrien Veronia, LAIA WILEY [[id number]]  Ulm, Letrice, Pollok [[id number]]  BARBRA LANG JINNY Date of discharge: 05/15/2024  Admitting diagnosis: Maternal care due to low transverse uterine scar from previous cesarean delivery [O34.211] Indication for care in labor or delivery [O75.9] Intrauterine pregnancy: [redacted]w[redacted]d     Secondary diagnosis:  Principal Problem:   Indication for care in labor or delivery Active Problems:   AMA (advanced maternal age) multigravida 35+   Rh negative state in antepartum period   Dichorionic diamniotic twin gestation   Unwanted fertility   Obesity affecting pregnancy, antepartum   History of 3 cesarean sections   Myometrial thinning of lower segment in second trimester  Additional problems: None    Discharge diagnosis: Term Pregnancy Delivered                                              Post partum procedures:None Augmentation: N/A Complications: None  Hospital course: Sceduled C/S   38 y.o. yo [id number] at [redacted]w[redacted]d was admitted to the hospital 05/13/2024 for scheduled cesarean section with the following indication:Elective Repeat and Multifetal Gestation.Delivery details are as follows:  Membrane Rupture Time/Date:    Keiara, Sneeringer [[id number]]  10:30 AM    Adrien Veronia, SHERALEE QAZI [[id number]]  10:33 AM,   Kisha, Messman [[id number]]  05/13/2024    Susannah, Carbin [[id number]]  05/13/2024  Delivery Method:   Adrien Veronia, DENYLA CORTESE [[id number]]  C-Section, Low Transverse    Insiya, Oshea [[id number]]  C-Section, Low Transverse Operative Delivery:N/A Details of operation can  be found in separate operative note.  Patient had a postpartum course complicated by nothing. She was feeling well on POD#2 and felt ready to go home. HGB was slightly decreased but not grossly so and she was started on iron  supplement.  She is ambulating, tolerating a regular diet, passing flatus, and urinating well. Patient is discharged home in stable condition on  05/15/24        Newborn Data: Birth date:   Athina, Fahey [[id number]]  05/13/2024    Destry, Bezdek [[id number]]  05/13/2024 Birth time:   Misheel, Gowans [[id number]]  10:31 AM    Adrien Veronia Loistine Hadassah [[id number]]  10:34 AM Gender:   Casi, Westerfeld [[id number]]  Female    Tanyika, Barros [[id number]]  Female Living status:   Meliyah, Simon [[id number]]  Living    [address] Luka, Reisch [[id number]]  Living Apgars:   Keishawn, Darsey [[id number]]  [address] [[id number]]  [address]   Anita, Laguna [[id number]]  [address] [[id number]]  9  Weight:   Jailynne, Opperman [[id number]]  2590 g    Taisley, Mordan [[id number]]  2790 g    Magnesium Sulfate received: No BMZ received: No Rhophylac :Yes - received 8/12 MMR:Yes T-DaP:Given prenatally Flu: N/A RSV Vaccine received: No Transfusion:No  Immunizations received: Immunization History  Administered Date(s) Administered   Influenza,inj,Quad PF,6+ Mos 08/10/2019   Tdap 03/15/2024    Physical exam  Vitals:   05/14/24 0423 05/14/24 1548 05/14/24 2102 05/15/24 0601  BP: 120/71 130/87 121/74 125/72  Pulse: 66 76 84   Resp: 17 18 18 18   Temp: 97.7 F (36.5 C) 98.6 F (37 C) 98.6 F (37 C) 98.1 F (36.7 C)  TempSrc: Oral Oral Oral Oral  SpO2: 98% 98% 99% 98%  Weight:      Height:       General: alert, cooperative, and no distress Lochia: appropriate Uterine Fundus:  firm Incision: Healing well with no significant drainage, No significant erythema, Dressing is clean, dry, and intact DVT Evaluation: No evidence of DVT seen on physical exam. Labs: Lab Results  Component Value Date   WBC 14.2 (H) 05/14/2024   HGB 10.6 (L) 05/14/2024   HCT 30.1 (L) 05/14/2024   MCV 90.9 05/14/2024   PLT 106 (L) 05/14/2024      Latest Ref Rng & Units 05/13/2024    3:17 PM  CMP  Creatinine 0.44 - 1.00 mg/dL 9.30    Edinburgh Score:    05/15/2024   11:45 AM  Edinburgh Postnatal Depression Scale Screening Tool  I have been able to laugh and see the funny side of things. 0   Edinburgh Postnatal Depression Scale Total: 2   After visit meds:  Allergies as of 05/15/2024   No Known Allergies      Medication List     STOP taking these medications    aspirin  EC 81 MG tablet   pantoprazole  40 MG tablet Commonly known as: Protonix        TAKE these medications    coconut oil Oil Apply 1 Application topically as needed.   ferrous gluconate  324 MG tablet Commonly known as: FERGON Take 1 tablet (324 mg total) by mouth every other day. Start taking on: May 17, 2024   ibuprofen  600 MG tablet Commonly known as: ADVIL  Take 1 tablet (600 mg total) by mouth every 6 (six) hours.   oxyCODONE  5 MG immediate release tablet Commonly known as: Oxy IR/ROXICODONE  Take 1-2 tablets (5-10 mg total) by mouth every 4 (four) hours as needed for moderate pain (pain score 4-6).   PRENATAL VITAMIN PO Take 1 tablet by mouth daily.   senna-docusate 8.6-50 MG tablet Commonly known as: Senokot-S Take 2 tablets by mouth daily. Start taking on: May 16, 2024   witch hazel-glycerin  pad Commonly known as: TUCKS Apply 1 Application topically as needed for hemorrhoids.               Discharge Care Instructions  (From admission, onward)           Start     Ordered   05/15/24 0000  Discharge wound care:       Comments: In the shower, let soapy water  drip  on the wound (don't scrub). Pat it dry. If your skin folds over the incision, put a cloth pad on it to keep it from getting sweaty. Within a week, your wound will be mostly healed. But look out for issues: If you develop a fever or if the skin surrounding the incision turns red, it starts oozing green or pus-colored liquid, or it becomes hard or painful, call your doctor. These could be signs of infection.  You can remove the dressing completely in 7-10 days after your C-section. You may want to put another bandage on to protect your clothes  05/15/24 1316             Discharge home in stable condition Infant Feeding: Bottle Infant Disposition:home with mother Discharge instruction: per After Visit Summary and Postpartum booklet. Activity: Advance as tolerated. Pelvic rest for 6 weeks.  Diet: routine diet Future Appointments: Future Appointments  Date Time Provider Department Center  05/21/2024 10:20 AM [facility] NURSE [facility]  07/02/2024 11:15 AM Zina Jerilynn LABOR, MD [facility]   Follow up Visit:  Please schedule this patient for a In person postpartum visit in 4 weeks with the following provider: Any provider. Additional Postpartum F/U:Incision check 1 week  High risk pregnancy complicated by: twins Delivery mode:     Meron, Bocchino [[id number]]  C-Section, Low Transverse    Brenisha, Tsui [[id number]]  C-Section, Low Transverse Anticipated Birth Control:  BTL done [facility]   05/15/2024 Suzen Maryan Masters, MD

## 2024-05-13 NOTE — H&P (Signed)
 Obstetric Preoperative History and Physical  Patricia Lam is a 38 y.o. [phone] with IUP at [redacted]w[redacted]d presenting for scheduled cesarean section.  Reports good fetal movement, no bleeding, no contractions, no leaking of fluid.  No acute preoperative concerns.    Cesarean Section Indication: prior CS x3, multiple gestation  Prenatal Course Source of Care: [facility]  with onset of care at 9 weeks Pregnancy complications or risks: Patient Active Problem List   Diagnosis Date Noted   Bacterial vaginosis 03/30/2024   Myometrial thinning of lower segment in second trimester 01/17/2024   Obesity affecting pregnancy, antepartum 12/15/2023   History of 3 cesarean sections 12/15/2023   Dichorionic diamniotic twin gestation 11/02/2023   Unwanted fertility 11/02/2023   Rh negative state in antepartum period 10/26/2023   Supervision of high risk pregnancy, antepartum 10/25/2023   AMA (advanced maternal age) multigravida 35+ 10/25/2023   She plans to bottle feed She desires bilateral tubal ligation for postpartum contraception.   Prenatal labs and studies: ABO, Rh: --/--/O NEG (08/08 9061) Antibody: POS (08/08 9061) Rubella: 2.70 (01/22 1026) RPR: NON REACTIVE (08/08 0933)  HBsAg: Negative (01/22 1026)  HIV: Non Reactive (06/13 0837)  GBS:  1 hr Glucola: nl Genetic screening normal Anatomy US : normal  Prenatal Transfer Tool  Maternal Diabetes: No Genetic Screening: Normal Maternal Ultrasounds/Referrals: Isolated EIF (echogenic intracardiac focus) Fetal Ultrasounds or other Referrals:  None Maternal Substance Abuse:  No Significant Maternal Medications:  None Significant Maternal Lab Results: Rh negative and Other:  GBS collected/pending  Past Medical History:  Diagnosis Date   Medical history non-contributory    Status post repeat low transverse cesarean section 07/18/2017    Past Surgical History:  Procedure Laterality Date   CESAREAN SECTION     CESAREAN SECTION N/A  07/18/2017   Procedure: CESAREAN SECTION;  Surgeon: Barbra Lang PARAS, DO;  [facility];  Service: Obstetrics;  Laterality: N/A;    OB History  Gravida Para Term Preterm AB Living  4 3 3  0 0 3  SAB IAB Ectopic Multiple Live Births  0 0 0 0 3    # Outcome Date GA Lbr Len/2nd Weight Sex Type Anes PTL Lv  4 Current           3 Term 07/18/17 [redacted]w[redacted]d  3221 g F CS-LTranv Spinal  LIV     Birth Comments: wnl  2 Term 2011    M CS-LTranv   LIV  1 Term 06/15/05    F CS-LTranv   LIV    Social History   Socioeconomic History   Marital status: Married    Spouse name: Not on file   Number of children: 3   Years of education: Not on file   Highest education level: 8th grade  Occupational History   Not on file  Tobacco Use   Smoking status: Never   Smokeless tobacco: Never  Vaping Use   Vaping status: Never Used  Substance and Sexual Activity   Alcohol use: No   Drug use: No   Sexual activity: Yes    Birth control/protection: None  Other Topics Concern   Not on file  Social History Narrative   Not on file   Social Drivers of Health   Financial Resource Strain: Not on file  Food Insecurity: Food Insecurity Present (05/13/2024)   Hunger Vital Sign    Worried About Running Out of Food in the Last Year: Never true    Ran Out of Food in the Last  Year: Sometimes true  Transportation Needs: No Transportation Needs (05/13/2024)   PRAPARE - Administrator, Civil Service (Medical): No    Lack of Transportation (Non-Medical): No  Physical Activity: Not on file  Stress: Not on file  Social Connections: Not on file    Family History  Problem Relation Age of Onset   Hypertension Father    Hypertension Brother    Obesity Brother    Cancer Maternal Aunt    Asthma Neg Hx    Diabetes Neg Hx    Heart disease Neg Hx     Medications Prior to Admission  Medication Sig Dispense Refill Last Dose/Taking   aspirin  EC 81 MG tablet Take 1 tablet (81 mg total) by  mouth daily. Swallow whole. 30 tablet 12 Taking   Prenatal Vit-Fe Fumarate-FA (PRENATAL VITAMIN PO) Take 1 tablet by mouth daily.    Taking   pantoprazole  (PROTONIX ) 40 MG tablet Take 1 tablet (40 mg total) by mouth 2 (two) times daily before a meal. (Patient taking differently: Take 40 mg by mouth as needed.) 60 tablet 1 Not Taking    No Known Allergies  Review of Systems: Pertinent items noted in HPI and remainder of comprehensive ROS otherwise negative.  Physical Exam: BP 135/85   Pulse 83   Temp 98 F (36.7 C) (Oral)   Resp 15   Ht 5' 4 (1.626 m)   Wt 108 kg   LMP 08/14/2023   SpO2 98%   BMI 40.87 kg/m   CONSTITUTIONAL: Well-developed, well-nourished female in no acute distress.  HENT:  Normocephalic, atraumatic, External right and left ear normal. Oropharynx is clear and moist EYES: Conjunctivae and EOM are normal. Pupils are equal, round, and reactive to light. No scleral icterus.  NECK: Normal range of motion, supple, no masses SKIN: Skin is warm and dry. No rash noted. Not diaphoretic. No erythema. No pallor. NEUROLOGIC: Alert and oriented to person, place, and time. Normal reflexes, muscle tone coordination. No cranial nerve deficit noted. PSYCHIATRIC: Normal mood and affect. Normal behavior. Normal judgment and thought content. CARDIOVASCULAR: Normal heart rate noted, regular rhythm RESPIRATORY: Effort and breath sounds normal, no problems with respiration noted ABDOMEN: Soft, nontender, nondistended, gravid. Well-healed Pfannenstiel incision. PELVIC: Deferred MUSCULOSKELETAL: Normal range of motion. No edema and no tenderness. 2+ distal pulses.  Pertinent Labs/Studies:   Results for orders placed or performed during the hospital encounter of 05/10/24 (from the past 72 hours)  CBC     Status: Abnormal   Collection Time: 05/10/24  9:33 AM  Result Value Ref Range   WBC 8.4 4.0 - 10.5 K/uL   RBC 3.90 3.87 - 5.11 MIL/uL   Hemoglobin 12.3 12.0 - 15.0 g/dL   HCT 63.4  63.9 - 53.9 %   MCV 93.6 80.0 - 100.0 fL   MCH 31.5 26.0 - 34.0 pg   MCHC 33.7 30.0 - 36.0 g/dL   RDW 86.1 88.4 - 84.4 %   Platelets 132 (L) 150 - 400 K/uL   nRBC 0.0 0.0 - 0.2 %    Comment: Performed at [facility], [address], [place], [place] [address]  RPR     Status: None   Collection Time: 05/10/24  9:33 AM  Result Value Ref Range   RPR Ser Ql NON REACTIVE NON REACTIVE    Comment: Performed at [facility], [address], [place], [place] [address]  Type and screen     Status: None   Collection  Time: 05/10/24  9:38 AM  Result Value Ref Range   ABO/RH(D) O NEG    Antibody Screen POS    Sample Expiration 05/13/2024,2359    Antibody Identification      PASSIVELY ACQUIRED ANTI-D Performed at [facility], [address] [place], [place] [address]     Assessment and Plan: Besan Ketchem is a 38 y.o. [phone] at [redacted]w[redacted]d being admitted for scheduled repeat cesarean section + salpingectomy. The risks of surgery were discussed with the patient including but were not limited to: bleeding which may require transfusion or reoperation; infection which may require antibiotics; injury to bowel, bladder, ureters or other surrounding organs; injury to the fetus; need for additional procedures including hysterectomy in the event of a life-threatening hemorrhage; formation of adhesions; placental abnormalities wth subsequent pregnancies; incisional problems; thromboembolic phenomenon and other postoperative/anesthesia complications. The patient concurred with the proposed plan, giving informed written consent for the procedure. Patient has been NPO since last night she will remain NPO for procedure. Anesthesia and OR aware. Preoperative prophylactic antibiotics and SCDs ordered on call to the OR. To OR when ready.   #multiple gestation - Di/di twins, discordance 11% at gUS 36+3 - Vtx/vtx  #Rh- status - O-, antibody+ - s/p RhoGAM at @28 +4  #concern for placenta  accreta Per anatomy scan, the proximity of the placenta to the bladder wall (with  prominent vessels), raises a small suspicion and warrants follow-up. Subsequent gUS report No evidence of placenta previa or placenta accreta spectrum at 24wk and Myometrial bladder interface appears normal. At 28wk. - resolved  B/l salpingectomy - consent signed 03/15/2024  Obesity AMA - BMI 40 - on ASA81  Girl/boy (no) Formula feed Salpingectomy   Barabara Maier, DO FMOB Fellow, Faculty practice [facility], Center for [facility] 05/13/24  9:25 AM

## 2024-05-14 ENCOUNTER — Encounter (HOSPITAL_COMMUNITY): Payer: Self-pay | Admitting: Family Medicine

## 2024-05-14 LAB — CBC
HCT: 30.1 % — ABNORMAL LOW (ref 36.0–46.0)
Hemoglobin: 10.6 g/dL — ABNORMAL LOW (ref 12.0–15.0)
MCH: 32 pg (ref 26.0–34.0)
MCHC: 35.2 g/dL (ref 30.0–36.0)
MCV: 90.9 fL (ref 80.0–100.0)
Platelets: 106 K/uL — ABNORMAL LOW (ref 150–400)
RBC: 3.31 MIL/uL — ABNORMAL LOW (ref 3.87–5.11)
RDW: 13.4 % (ref 11.5–15.5)
WBC: 14.2 K/uL — ABNORMAL HIGH (ref 4.0–10.5)
nRBC: 0 % (ref 0.0–0.2)

## 2024-05-14 LAB — CULTURE, BETA STREP (GROUP B ONLY): Strep Gp B Culture: NEGATIVE

## 2024-05-14 MED ORDER — RHO D IMMUNE GLOBULIN 1500 UNIT/2ML IJ SOSY
300.0000 ug | PREFILLED_SYRINGE | Freq: Once | INTRAMUSCULAR | Status: AC
  Administered 2024-05-14 (×2): 300 ug via INTRAVENOUS
  Filled 2024-05-14: qty 2

## 2024-05-14 NOTE — Progress Notes (Signed)
Ordered pt meal by Orlan Leavens Spanish Medical Interpreter.

## 2024-05-14 NOTE — Progress Notes (Signed)
 POSTPARTUM PROGRESS NOTE  POD #1  Subjective:  Patricia Lam is a 38 y.o. [phone] s/p rLTCS at [redacted]w[redacted]d.  She reports she doing well. No acute events overnight. She reports she is doing well. She denies any problems with ambulating, voiding or po intake. Denies nausea or vomiting. She has passed flatus. Pain is well controlled.  Lochia is appropriate.  Objective: Blood pressure 120/71, pulse 66, temperature 97.7 F (36.5 C), temperature source Oral, resp. rate 17, height 5' 4 (1.626 m), weight 108 kg, last menstrual period 08/14/2023, SpO2 98%, unknown if currently breastfeeding.  Physical Exam:  General: alert, cooperative and no distress Chest: no respiratory distress Heart:regular rate, distal pulses intact Abdomen: soft, nontender,  Uterine Fundus: firm, appropriately tender DVT Evaluation: No calf swelling or tenderness Extremities: Trace edema Skin: warm, dry; incision clean/dry/intact w/ honeycomb dressing in place  Recent Labs    05/13/24 1517 05/14/24 0717  HGB 13.3 10.6*  HCT 38.0 30.1*    Assessment/Plan: Patricia Lam is a 38 y.o. [phone] s/p repeat LTCS at [redacted]w[redacted]d for prior C-section x 3.  POD#1 - Doing welll; pain well controlled. H/H appropriate  Routine postpartum care  OOB, ambulated  Lovenox  for VTE prophylaxis Acute blood loss anemia significant to this hospitalization: asymptomatic  Mild  Contraception: BTS Feeding: Breast  Dispo: Plan for discharge tomorrow in the next day.   LOS: 1 day   Steffan Rover, MD Attending Family Medicine Physician, [facility] for [facility], [facility]    05/14/2024, 2:48 PM

## 2024-05-15 LAB — RH IG WORKUP (INCLUDES ABO/RH)
Fetal Screen: NEGATIVE
Gestational Age(Wks): 37
Unit division: 0

## 2024-05-15 LAB — SURGICAL PATHOLOGY

## 2024-05-15 MED ORDER — COCONUT OIL OIL: 1.0000 | TOPICAL_OIL | Status: AC | PRN

## 2024-05-15 MED ORDER — WITCH HAZEL-GLYCERIN EX PADS: 1.0000 | MEDICATED_PAD | CUTANEOUS | Status: DC | PRN

## 2024-05-15 MED ORDER — OXYCODONE HCL 5 MG PO TABS: 5.0000 mg | ORAL_TABLET | ORAL | 0 refills | Status: AC | PRN

## 2024-05-15 MED ORDER — SENNOSIDES-DOCUSATE SODIUM 8.6-50 MG PO TABS: 2.0000 | ORAL_TABLET | Freq: Every day | ORAL | 0 refills | Status: AC

## 2024-05-15 MED ORDER — IBUPROFEN 600 MG PO TABS: 600.0000 mg | ORAL_TABLET | Freq: Four times a day (QID) | ORAL | 0 refills | Status: DC

## 2024-05-15 MED ORDER — FERROUS GLUCONATE 324 (38 FE) MG PO TABS
324.0000 mg | ORAL_TABLET | ORAL | Status: DC
  Administered 2024-05-15 (×2): 324 mg via ORAL
  Filled 2024-05-15: qty 1

## 2024-05-15 MED ORDER — FERROUS GLUCONATE 324 (38 FE) MG PO TABS: 324.0000 mg | ORAL_TABLET | ORAL | 2 refills | Status: DC

## 2024-05-15 NOTE — Progress Notes (Signed)
 POSTPARTUM PROGRESS NOTE  POD #2  Subjective:  Patricia Lam is a 38 y.o. [phone] s/p rLTCS at [redacted]w[redacted]d.  She reports she doing well. No acute events overnight. She reports she is doing well. She denies any problems with ambulating, voiding or po intake. Denies nausea or vomiting. She has passed flatus. Pain is well controlled.  Lochia is appropriate.  Objective: Blood pressure 125/72, pulse 84, temperature 98.1 F (36.7 C), temperature source Oral, resp. rate 18, height 5' 4 (1.626 m), weight 108 kg, last menstrual period 08/14/2023, SpO2 98%, unknown if currently breastfeeding.  Physical Exam:  General: alert, cooperative and no distress Chest: no respiratory distress Heart:regular rate, distal pulses intact Abdomen: soft, nontender,  Uterine Fundus: firm, appropriately tender DVT Evaluation: No calf swelling or tenderness Extremities: Trace edema Skin: warm, dry; incision clean/dry/intact w/ honeycomb dressing in place  Recent Labs    05/13/24 1517 05/14/24 0717  HGB 13.3 10.6*  HCT 38.0 30.1*    Assessment/Plan: Patricia Lam is a 38 y.o. [phone] s/p repeat LTCS at [redacted]w[redacted]d for prior C-section x 3.  POD#1 - Doing welll; pain well controlled. H/H appropriate  Routine postpartum care  OOB, ambulated  Lovenox  for VTE prophylaxis Acute blood loss anemia significant to this hospitalization: asymptomatic  Mild, Started Fe today  Contraception: BTS Feeding: Breast  Dispo: Plan for discharge tomorrow in the next day.   LOS: 2 days   Patricia Maryan Masters, MD, MPH, ABFM, [facility] Attending Physician Center for [facility]  05/15/2024, 11:14 AM

## 2024-05-15 NOTE — Patient Instructions (Signed)
 If interested in an outpatient lactation consult in office or virtually please reach out to us  at [facility] (First Floor) [address] [place] Please call [phone] and press 4 for lactation.    Lactation support groups:  [facility], Tuesdays 10:00 am -12:00 pm at [address] on the second floor in the conference room, lactating parents and lap babies welcome.  [email or web address]  [email or web address]  Si est interesado en una consulta ambulatoria sobre lactancia en el consultorio o virtualmente, comunquese con nosotros al [facility] Geophysicist/field seismologist (primer piso) [address] [place], [place] Por favor llame al [phone] y presione 4 para lactancia.  Grupos de apoyo para la lactancia:  [facility], martes de 10:00 a. m. a 12:00 p. m., en [address], segundo piso, sala de conferencias. Se admiten madres lactantes y bebs en regazo.  Delaney Mandril, [facility] Center for [facility]

## 2024-05-17 ENCOUNTER — Encounter: Admitting: Family Medicine

## 2024-05-21 ENCOUNTER — Other Ambulatory Visit: Payer: Self-pay

## 2024-05-21 ENCOUNTER — Ambulatory Visit (INDEPENDENT_AMBULATORY_CARE_PROVIDER_SITE_OTHER)

## 2024-05-21 VITALS — BP 124/67 | HR 76 | Ht 65.0 in | Wt 213.0 lb

## 2024-05-21 DIAGNOSIS — Z4889 Encounter for other specified surgical aftercare: Secondary | ICD-10-CM

## 2024-05-21 NOTE — Progress Notes (Signed)
 Incision Check Visit  Patricia Lam is here for incision check following repeat c-section on 05/13/24 with Di/Di twins. Patient has a history of prior c-section in 2006, 2011, and 2018.    Assessment: Honeycomb dressing intact. RN removed dressing. Incision is clean, dry, intact, well approximated with dermabond present. No s/s of infection. ABD pad placed to keep area from excess moisture.   Education: Reviewed good daily wound care and s/s of infection with patient. Advised patient to contact our office if any s/s of infection noted. Also advised patient to keep clean dry washcloth over incision to prevent accumulation of moisture. Patient verbalized understanding.  Patient will follow up post partum visit on 07/02/24 at 1115. Patient confirmed scheduled appointment.  This visit used in house Spanish interpreter Patricia Lam throughout this visit.  Patricia Pendleton, RN 05/21/2024  10:23 AM

## 2024-05-22 ENCOUNTER — Ambulatory Visit
Admission: EM | Admit: 2024-05-22 | Discharge: 2024-05-22 | Disposition: A | Discharge status: Home / Self Care | Attending: Physician Assistant | Admitting: Physician Assistant

## 2024-05-22 DIAGNOSIS — L0591 Pilonidal cyst without abscess: Secondary | ICD-10-CM

## 2024-05-22 MED ORDER — SULFAMETHOXAZOLE-TRIMETHOPRIM 800-160 MG PO TABS: 1.0000 | ORAL_TABLET | Freq: Two times a day (BID) | ORAL | 0 refills | Status: AC

## 2024-05-22 NOTE — ED Triage Notes (Addendum)
 Pt presents to [facility] with co abscess to back above the coccyx since 4 days ago. Pt reports it is painful and  has been draining purulent drainage. Pt has been applying otc creams with minimal improvement. Pt reports she had a c-sarian on 8/11 to twins and is taking pain medication and prenatals.  Triage completed with interpretation with AML #[id number]

## 2024-05-23 NOTE — ED Provider Notes (Signed)
 [facility]    CSN: [id number] Arrival date & time: 05/22/24  9051      History   Chief Complaint Chief Complaint  Patient presents with   Abscess    HPI Patricia Lam is a 38 y.o. female.   Patient here today for evaluation of abscess above coccyx that she noted 4 days ago. She has had some drainage from same. She has tried OTC cream withtout improvement. She has not had any fever.  The history is provided by the patient.  Abscess Associated symptoms: no fever, no nausea and no vomiting     Past Medical History:  Diagnosis Date   Medical history non-contributory    Status post repeat low transverse cesarean section 07/18/2017    Patient Active Problem List   Diagnosis Date Noted   Indication for care in labor or delivery 05/13/2024   Bacterial vaginosis 03/30/2024   Myometrial thinning of lower segment in second trimester 01/17/2024   Obesity affecting pregnancy, antepartum 12/15/2023   History of 3 cesarean sections 12/15/2023   Dichorionic diamniotic twin gestation 11/02/2023   Unwanted fertility 11/02/2023   Rh negative state in antepartum period 10/26/2023   Supervision of high risk pregnancy, antepartum 10/25/2023   AMA (advanced maternal age) multigravida 35+ 10/25/2023    Past Surgical History:  Procedure Laterality Date   CESAREAN SECTION     CESAREAN SECTION N/A 07/18/2017   Procedure: CESAREAN SECTION;  Surgeon: Barbra Lang PARAS, DO;  Location: [facility];  Service: Obstetrics;  Laterality: N/A;   CESAREAN SECTION MULTI-GESTATIONAL N/A 05/13/2024   Procedure: CESAREAN SECTION, MULTIPLE FETUSES;  Surgeon: Barbra Lang PARAS, DO;  Location: [facility];  Service: Obstetrics;  Laterality: N/A;   TUBAL LIGATION Bilateral 05/13/2024   Procedure: LIGATION, FALLOPIAN TUBE, BILATERAL;  Surgeon: Barbra Lang PARAS, DO;  Location: [facility];  Service: Obstetrics;  Laterality: Bilateral;    OB History     Gravida  4   Para   4   Term  4   Preterm  0   AB  0   Living  5      SAB  0   IAB  0   Ectopic  0   Multiple  1   Live Births  5            Home Medications    Prior to Admission medications   Medication Sig Start Date End Date Taking? Authorizing Provider  sulfamethoxazole -trimethoprim  (BACTRIM  DS) 800-160 MG tablet Take 1 tablet by mouth 2 (two) times daily for 7 days. 05/22/24 05/29/24 Yes Joana Nolton F, PA-C  coconut oil OIL Apply 1 Application topically as needed. Patient not taking: Reported on 05/21/2024 05/15/24   Eldonna Suzen Octave, MD  oxyCODONE  (OXY IR/ROXICODONE ) 5 MG immediate release tablet Take 1-2 tablets (5-10 mg total) by mouth every 4 (four) hours as needed for moderate pain (pain score 4-6). 05/15/24   Eldonna Suzen Octave, MD  Prenatal Vit-Fe Fumarate-FA (PRENATAL VITAMIN PO) Take 1 tablet by mouth daily.     [provider]  senna-docusate (SENOKOT-S) 8.6-50 MG tablet Take 2 tablets by mouth daily. Patient not taking: Reported on 05/21/2024 05/16/24   Eldonna Suzen Octave, MD    Family History Family History  Problem Relation Age of Onset   Hypertension Father    Hypertension Brother    Obesity Brother    Cancer Maternal Aunt    Asthma Neg Hx    Diabetes Neg Hx  Heart disease Neg Hx     Social History Social History   Tobacco Use   Smoking status: Never   Smokeless tobacco: Never  Vaping Use   Vaping status: Never Used  Substance Use Topics   Alcohol use: No   Drug use: No     Allergies   Patient has no known allergies.   Review of Systems Review of Systems  Constitutional:  Negative for chills and fever.  Eyes:  Negative for discharge and redness.  Respiratory:  Negative for shortness of breath.   Gastrointestinal:  Negative for abdominal pain, nausea and vomiting.  Skin:  Positive for color change and wound.     Physical Exam Triage Vital Signs ED Triage Vitals  Encounter Vitals Group     BP 05/22/24 1048 (!)  145/92     Girls Systolic BP Percentile --      Girls Diastolic BP Percentile --      Boys Systolic BP Percentile --      Boys Diastolic BP Percentile --      Pulse Rate 05/22/24 1048 79     Resp 05/22/24 1048 19     Temp 05/22/24 1048 98.8 F (37.1 C)     Temp src --      SpO2 05/22/24 1048 98 %     Weight --      Height --      Head Circumference --      Peak Flow --      Pain Score 05/22/24 1045 10     Pain Loc --      Pain Education --      Exclude from Growth Chart --    No data found.  Updated Vital Signs BP (!) 145/92   Pulse 79   Temp 98.8 F (37.1 C)   Resp 19   LMP 08/14/2023   SpO2 98%   Breastfeeding No   Visual Acuity Right Eye Distance:   Left Eye Distance:   Bilateral Distance:    Right Eye Near:   Left Eye Near:    Bilateral Near:     Physical Exam Vitals and nursing note reviewed.  Constitutional:      General: She is not in acute distress.    Appearance: Normal appearance. She is not ill-appearing.  HENT:     Head: Normocephalic and atraumatic.  Eyes:     Conjunctiva/sclera: Conjunctivae normal.  Cardiovascular:     Rate and Rhythm: Normal rate.  Pulmonary:     Effort: Pulmonary effort is normal. No respiratory distress.  Skin:    Comments: Approx 2 cm area of mildly erythematous area of induration with central wound and purulent drainage.  Neurological:     Mental Status: She is alert.  Psychiatric:        Mood and Affect: Mood normal.        Behavior: Behavior normal.        Thought Content: Thought content normal.      UC Treatments / Results  Labs (all labs ordered are listed, but only abnormal results are displayed) Labs Reviewed - No data to display  EKG   Radiology No results found.  Procedures Procedures (including critical care time)  Medications Ordered in UC Medications - No data to display  Initial Impression / Assessment and Plan / UC Course  I have reviewed the triage vital signs and the nursing  notes.  Pertinent labs & imaging results that were available during my care of the  patient were reviewed by me and considered in my medical decision making (see chart for details).   Bactrim  prescribed for suspected pilonidal cyst.  Advised warm compresses to promote continued drainage.  Advised further evaluation in the emergency room if no improvement.  Final Clinical Impressions(s) / UC Diagnoses   Final diagnoses:  Pilonidal cyst   Discharge Instructions   None    ED Prescriptions     Medication Sig Dispense Auth. Provider   sulfamethoxazole -trimethoprim  (BACTRIM  DS) 800-160 MG tablet Take 1 tablet by mouth 2 (two) times daily for 7 days. 14 tablet Billy Asberry FALCON, PA-C      PDMP not reviewed this encounter.   Billy Asberry FALCON, PA-C 05/23/24 [phone]

## 2024-05-24 ENCOUNTER — Encounter: Admitting: Family Medicine

## 2024-05-31 ENCOUNTER — Encounter: Admitting: Family Medicine

## 2024-06-10 ENCOUNTER — Encounter (HOSPITAL_COMMUNITY): Payer: Self-pay | Admitting: Emergency Medicine

## 2024-06-10 ENCOUNTER — Ambulatory Visit (HOSPITAL_COMMUNITY): Admission: EM | Admit: 2024-06-10 | Discharge: 2024-06-10 | Disposition: A | Discharge status: Home / Self Care

## 2024-06-10 DIAGNOSIS — G8918 Other acute postprocedural pain: Secondary | ICD-10-CM | POA: Diagnosis not present

## 2024-06-10 DIAGNOSIS — T8130XA Disruption of wound, unspecified, initial encounter: Secondary | ICD-10-CM

## 2024-06-10 NOTE — ED Triage Notes (Signed)
 Used spanish interpretor   Pt had c section 8/11 and reports pain in he area and noticed two lumps. Reports sent message to her doctor but hasn't received a reply. The lumps have been for 5 days and pain started 3 days ago. Sometimes has redness at area. Denies any drainage.  Used an antibiotic cream some on area.

## 2024-06-10 NOTE — ED Provider Notes (Signed)
 [facility]    CSN: [id number] Arrival date & time: 06/10/24  1025      History   Chief Complaint Chief Complaint  Patient presents with   Post-op Problem    HPI Patricia Lam is a 38 y.o. female.   HPI  Patient is a 38 year old female who recently had twins via C-section delivery on 05/13/2024.  Over the last couple days she noticed that the edges of her C-section were beginning to ooze slightly with serosanguineous tinged fluid.  She also endorses itching and burning along incision site.  Patient denies any fever.  Patient denies any chills.  Patient denies any vaginal bleeding or discharge.  She states she called her OB/GYN but has yet to receive her response.  She is concerned about infection and starting yesterday started applying triple antibiotic ointment to incision.  She is also taking ibuprofen  for pain.  Prior to this, patient has been recovering well from C-section and has regained her strength and ability to ambulate.  Denies any weakness at this time.  Denies any symptoms of presyncope or lightheadedness.  Past Medical History:  Diagnosis Date   Medical history non-contributory    Status post repeat low transverse cesarean section 07/18/2017    Patient Active Problem List   Diagnosis Date Noted   Indication for care in labor or delivery 05/13/2024   Bacterial vaginosis 03/30/2024   Myometrial thinning of lower segment in second trimester 01/17/2024   Obesity affecting pregnancy, antepartum 12/15/2023   History of 3 cesarean sections 12/15/2023   Dichorionic diamniotic twin gestation 11/02/2023   Unwanted fertility 11/02/2023   Rh negative state in antepartum period 10/26/2023   Supervision of high risk pregnancy, antepartum 10/25/2023   AMA (advanced maternal age) multigravida 35+ 10/25/2023    Past Surgical History:  Procedure Laterality Date   CESAREAN SECTION     CESAREAN SECTION N/A 07/18/2017   Procedure: CESAREAN SECTION;   Surgeon: Barbra Lang PARAS, DO;  Location: [facility];  Service: Obstetrics;  Laterality: N/A;   CESAREAN SECTION MULTI-GESTATIONAL N/A 05/13/2024   Procedure: CESAREAN SECTION, MULTIPLE FETUSES;  Surgeon: Barbra Lang PARAS, DO;  Location: [facility];  Service: Obstetrics;  Laterality: N/A;   TUBAL LIGATION Bilateral 05/13/2024   Procedure: LIGATION, FALLOPIAN TUBE, BILATERAL;  Surgeon: Barbra Lang PARAS, DO;  Location: [facility];  Service: Obstetrics;  Laterality: Bilateral;    OB History     Gravida  4   Para  4   Term  4   Preterm  0   AB  0   Living  5      SAB  0   IAB  0   Ectopic  0   Multiple  1   Live Births  5            Home Medications    Prior to Admission medications   Medication Sig Start Date End Date Taking? Authorizing Provider  coconut oil OIL Apply 1 Application topically as needed. Patient not taking: Reported on 05/21/2024 05/15/24   Eldonna Suzen Octave, MD  oxyCODONE  (OXY IR/ROXICODONE ) 5 MG immediate release tablet Take 1-2 tablets (5-10 mg total) by mouth every 4 (four) hours as needed for moderate pain (pain score 4-6). 05/15/24   Eldonna Suzen Octave, MD  Prenatal Vit-Fe Fumarate-FA (PRENATAL VITAMIN PO) Take 1 tablet by mouth daily.     [provider]  senna-docusate (SENOKOT-S) 8.6-50 MG tablet Take 2 tablets by mouth daily. Patient not  taking: Reported on 05/21/2024 05/16/24   Eldonna Suzen Octave, MD    Family History Family History  Problem Relation Age of Onset   Hypertension Father    Hypertension Brother    Obesity Brother    Cancer Maternal Aunt    Asthma Neg Hx    Diabetes Neg Hx    Heart disease Neg Hx     Social History Social History   Tobacco Use   Smoking status: Never   Smokeless tobacco: Never  Vaping Use   Vaping status: Never Used  Substance Use Topics   Alcohol use: No   Drug use: No     Allergies   Patient has no known allergies.   Review of Systems Review of Systems  All  other systems reviewed and are negative.    Physical Exam Triage Vital Signs ED Triage Vitals  Encounter Vitals Group     BP 06/10/24 1111 (!) 133/91     Girls Systolic BP Percentile --      Girls Diastolic BP Percentile --      Boys Systolic BP Percentile --      Boys Diastolic BP Percentile --      Pulse Rate 06/10/24 1111 72     Resp 06/10/24 1111 18     Temp 06/10/24 1111 98.9 F (37.2 C)     Temp Source 06/10/24 1111 Oral     SpO2 06/10/24 1111 98 %     Weight --      Height --      Head Circumference --      Peak Flow --      Pain Score 06/10/24 1108 5     Pain Loc --      Pain Education --      Exclude from Growth Chart --    No data found.  Updated Vital Signs BP (!) 133/91 (BP Location: Right Arm)   Pulse 72   Temp 98.9 F (37.2 C) (Oral)   Resp 18   LMP 08/14/2023   SpO2 98%   Breastfeeding No   Visual Acuity Right Eye Distance:   Left Eye Distance:   Bilateral Distance:    Right Eye Near:   Left Eye Near:    Bilateral Near:     Physical Exam Vitals and nursing note reviewed.  Constitutional:      General: She is not in acute distress.    Appearance: Normal appearance. She is well-developed. She is not ill-appearing or diaphoretic.  HENT:     Head: Normocephalic and atraumatic.  Eyes:     Conjunctiva/sclera: Conjunctivae normal.  Cardiovascular:     Rate and Rhythm: Normal rate and regular rhythm.     Heart sounds: No murmur heard. Pulmonary:     Effort: Pulmonary effort is normal. No respiratory distress.     Breath sounds: Normal breath sounds.  Abdominal:     General: There is no distension.     Palpations: Abdomen is soft.     Tenderness: There is no abdominal tenderness. There is no guarding or rebound.  Genitourinary:    Vagina: No vaginal discharge.  Musculoskeletal:        General: No swelling.     Cervical back: Neck supple.  Skin:    General: Skin is warm and dry.     Capillary Refill: Capillary refill takes less than 2  seconds.         Comments: Patient has a well-healing Pfannenstiel C-section incision with mild wound  dehiscence on lateral borders.  She has some serosanguineous fluid draining from this but no evidence of purulent drainage.  No surrounding erythema.  No increased temperature.  No acute tenderness to palpation over incision.  Neurological:     Mental Status: She is alert.  Psychiatric:        Mood and Affect: Mood normal.      UC Treatments / Results  Labs (all labs ordered are listed, but only abnormal results are displayed) Labs Reviewed - No data to display  EKG   Radiology No results found.  Procedures Procedures (including critical care time)  Medications Ordered in UC Medications - No data to display  Initial Impression / Assessment and Plan / UC Course  I have reviewed the triage vital signs and the nursing notes.  Pertinent labs & imaging results that were available during my care of the patient were reviewed by me and considered in my medical decision making (see chart for details).    #Mild C-section wound dehiscence with drainage You were seen today for mild leaking at your C-section incision site.  No indication of systemic infection or local wound infection. We used Dermabond glue to help prevent any future leaking. No evidence of infection at this time requiring oral antibiotics Please return to urgent care or the emergency room if you start to develop a fever of 100.4 or higher, chills, full body sweats, weakness, aching bones Please wash C-section incision with soap and water  daily After washing apply triple antibiotic ointment to entire incision Follow-up with your OB/GYN in 2 weeks for reevaluation.  Final Clinical Impressions(s) / UC Diagnoses   Final diagnoses:  Post-op pain  Wound dehiscence     Discharge Instructions      La atendieron hoy por una fuga leve en la incisin de la cesrea. Usamos adhesivo Dermabond para prevenir futuras  fugas. No hay evidencia de infeccin en este momento. Por favor, regrese a urgencias si presenta fiebre de 38 C o ms, escalofros, sudoracin abundante, debilidad o dolor en los huesos. Lave la incisin de la cesrea con agua y jabn a diario. Despus de lavarla, aplique ungento antibitico triple en toda la incisin. Consulte con su gineclogo/obstetra en 2 semanas para una reevaluacin.   You were seen today for mild leaking at your C-section incision site.  We used Dermabond glue to help prevent any future leaking. No evidence of infection at this time Please return to urgent care or the emergency room if you start to develop a fever of 100.4 or higher, chills, full body sweats, weakness, aching bones Please wash C-section incision with soap and water  daily After washing apply triple antibiotic ointment to entire incision Follow-up with your OB/GYN in 2 weeks for reevaluation.      ED Prescriptions   None    PDMP not reviewed this encounter.   Lynwood Barter, DO 06/10/24 1302

## 2024-06-10 NOTE — Discharge Instructions (Addendum)
 La atendieron [employer] por una fuga leve en la incisin de la cesrea. Usamos adhesivo Dermabond para prevenir futuras fugas. No hay evidencia de infeccin en este momento. Por favor, regrese a urgencias si presenta fiebre de 38 C o ms, escalofros, sudoracin abundante, debilidad o dolor en los huesos. Lave la incisin de la cesrea con agua y jabn a diario. Despus de lavarla, aplique ungento antibitico triple en toda la incisin. Consulte con su gineclogo/obstetra en 2 semanas para una reevaluacin.   You were seen today for mild leaking at your C-section incision site.  We used Dermabond glue to help prevent any future leaking. No evidence of infection at this time Please return to urgent care or the emergency room if you start to develop a fever of 100.4 or higher, chills, full body sweats, weakness, aching bones Please wash C-section incision with soap and water  daily After washing apply triple antibiotic ointment to entire incision Follow-up with your OB/GYN in 2 weeks for reevaluation.

## 2024-07-02 ENCOUNTER — Other Ambulatory Visit: Payer: Self-pay

## 2024-07-02 ENCOUNTER — Encounter: Payer: Self-pay | Admitting: Obstetrics and Gynecology

## 2024-07-02 ENCOUNTER — Ambulatory Visit: Admitting: Obstetrics and Gynecology

## 2024-07-02 NOTE — Progress Notes (Signed)
 Post Partum Visit Note  Patricia Lam is a 38 y.o. [phone] female who presents for a postpartum visit. She is 7 weeks postpartum following a repeat cesarean section.  I have fully reviewed the prenatal and intrapartum course. The delivery was at 37/0 gestational weeks.  Anesthesia: spinal. Postpartum course has been complicated by possible wound problem. Baby is doing well. Baby is feeding by bottle - Similac Sensitive RS. Bleeding no bleeding. Bowel function is normal. Bladder function is normal. Patient is sexually active. Contraception method is tubal ligation. Postpartum depression screening: negative.   The pregnancy intention screening data noted above was reviewed. Potential methods of contraception were discussed. The patient elected to proceed with salpingectomy at time of c section.   Edinburgh Postnatal Depression Scale - 07/02/24 1128       Edinburgh Postnatal Depression Scale:  In the Past 7 Days   I have been able to laugh and see the funny side of things. 0    I have looked forward with enjoyment to things. 0    I have blamed myself unnecessarily when things went wrong. 0    I have been anxious or worried for no good reason. 0    I have felt scared or panicky for no good reason. 0    Things have been getting on top of me. 0    I have been so unhappy that I have had difficulty sleeping. 0    I have felt sad or miserable. 0    I have been so unhappy that I have been crying. 0    The thought of harming myself has occurred to me. 0    Edinburgh Postnatal Depression Scale Total 0          Health Maintenance Due  Topic Date Due   Hepatitis B Vaccines 19-59 Average Risk (1 of 3 - 19+ 3-dose series) Never done   HPV VACCINES (1 - 3-dose SCDM series) Never done   Influenza Vaccine  05/03/2024   COVID-19 Vaccine (1 - 2024-25 season) Never done    The following portions of the patient's history were reviewed and updated as appropriate: allergies, current  medications, past family history, past medical history, past social history, past surgical history, and problem list.  Review of Systems Pertinent items are noted in HPI.  Objective:  BP (!) 130/91   Pulse 74   Ht 5' 5 (1.651 m)   Wt 209 lb 3.2 oz (94.9 kg)   LMP 08/14/2023   Breastfeeding No   BMI 34.81 kg/m    General:  alert, cooperative, and no distress   Breasts:  not indicated  Lungs: clear to auscultation bilaterally  Heart:  regular rate and rhythm  Abdomen: soft, non-tender; bowel sounds normal; no masses,  no organomegaly   Wound well approximated incision, clean dry and intact  GU exam:  not indicated       Assessment:    Encounter for postpartum care  normal postpartum exam.   Plan:   Essential components of care per ACOG recommendations:  1.  Mood and well being: Patient with negative depression screening today. Reviewed local resources for support.  - Patient tobacco use? No.   - hx of drug use? No.    2. Infant care and feeding:  -Patient currently breastmilk feeding? No.  -Social determinants of health (SDOH) reviewed in EPIC. No concerns.  3. Sexuality, contraception and birth spacing - Patient does not want a pregnancy in the next year.  Desired family size is 5 children.  - Reviewed reproductive life planning. Reviewed contraceptive methods based on pt preferences and effectiveness.  Patient desired Female Sterilization today.   - Discussed birth spacing of 18 months  4. Sleep and fatigue -Encouraged family/partner/community support of 4 hrs of uninterrupted sleep to help with mood and fatigue  5. Physical Recovery  - Discussed patients delivery and complications. She describes her labor as good. - Patient had a cesarean section with small incision separation.  - Patient has urinary incontinence? No. - Patient is safe to resume physical and sexual activity  6.  Health Maintenance - HM due items addressed Yes - Last pap smear4/24/24.  Pap  smear not done at today's visit.  -Breast Cancer screening indicated? No.   7. Chronic Disease/Pregnancy Condition follow up: None  - PCP follow up  Jerilynn DELENA Buddle, MD Center for [facility], [facility]
